# Patient Record
Sex: Female | Born: 1975 | Race: White | Hispanic: No | Marital: Married | Smoking: Former smoker
Health system: Southern US, Community
[De-identification: ages and names within clinical notes are randomized; demographics above are authoritative.]

## PROBLEM LIST (undated history)

## (undated) DIAGNOSIS — R569 Unspecified convulsions: Secondary | ICD-10-CM

## (undated) DIAGNOSIS — G8929 Other chronic pain: Secondary | ICD-10-CM

## (undated) DIAGNOSIS — K200 Eosinophilic esophagitis: Secondary | ICD-10-CM

## (undated) DIAGNOSIS — K219 Gastro-esophageal reflux disease without esophagitis: Secondary | ICD-10-CM

## (undated) DIAGNOSIS — S62309A Unspecified fracture of unspecified metacarpal bone, initial encounter for closed fracture: Secondary | ICD-10-CM

## (undated) DIAGNOSIS — T7840XA Allergy, unspecified, initial encounter: Secondary | ICD-10-CM

## (undated) DIAGNOSIS — S069X9A Unspecified intracranial injury with loss of consciousness of unspecified duration, initial encounter: Secondary | ICD-10-CM

## (undated) DIAGNOSIS — R519 Headache, unspecified: Secondary | ICD-10-CM

## (undated) HISTORY — DX: Unspecified convulsions: R56.9

## (undated) HISTORY — DX: Eosinophilic esophagitis: K20.0

## (undated) HISTORY — PX: BRAIN SURGERY: SHX531

## (undated) HISTORY — PX: TOE SURGERY: SHX1073

## (undated) HISTORY — DX: Headache, unspecified: R51.9

## (undated) HISTORY — DX: Allergy, unspecified, initial encounter: T78.40XA

## (undated) HISTORY — PX: ACHILLES TENDON SURGERY: SHX542

## (undated) HISTORY — PX: STRABISMUS SURGERY: SHX218

## (undated) HISTORY — PX: UPPER GASTROINTESTINAL ENDOSCOPY: SHX188

## (undated) HISTORY — DX: Other chronic pain: G89.29

## (undated) HISTORY — DX: Gastro-esophageal reflux disease without esophagitis: K21.9

---

## 1997-11-07 DIAGNOSIS — S069XAA Unspecified intracranial injury with loss of consciousness status unknown, initial encounter: Secondary | ICD-10-CM

## 1997-11-07 DIAGNOSIS — S069X9A Unspecified intracranial injury with loss of consciousness of unspecified duration, initial encounter: Secondary | ICD-10-CM

## 1997-11-07 HISTORY — DX: Unspecified intracranial injury with loss of consciousness of unspecified duration, initial encounter: S06.9X9A

## 1997-11-07 HISTORY — PX: TRACHEOSTOMY: SUR1362

## 1997-11-07 HISTORY — DX: Unspecified intracranial injury with loss of consciousness status unknown, initial encounter: S06.9XAA

## 1998-10-19 ENCOUNTER — Encounter: Admission: RE | Admit: 1998-10-19 | Discharge: 1999-01-14 | Payer: Self-pay

## 1999-01-14 ENCOUNTER — Encounter
Admission: RE | Admit: 1999-01-14 | Discharge: 1999-04-14 | Payer: Self-pay | Admitting: Physical Medicine and Rehabilitation

## 1999-03-29 ENCOUNTER — Encounter: Payer: Self-pay | Admitting: Physical Medicine and Rehabilitation

## 1999-03-29 ENCOUNTER — Ambulatory Visit (HOSPITAL_COMMUNITY)
Admission: RE | Admit: 1999-03-29 | Discharge: 1999-03-29 | Payer: Self-pay | Admitting: Physical Medicine and Rehabilitation

## 1999-06-18 ENCOUNTER — Encounter: Admission: RE | Admit: 1999-06-18 | Discharge: 1999-09-16 | Payer: Self-pay

## 1999-08-12 ENCOUNTER — Encounter
Admission: RE | Admit: 1999-08-12 | Discharge: 1999-11-10 | Payer: Self-pay | Admitting: Physical Medicine and Rehabilitation

## 1999-08-23 ENCOUNTER — Encounter
Admission: RE | Admit: 1999-08-23 | Discharge: 1999-11-21 | Payer: Self-pay | Admitting: Physical Medicine and Rehabilitation

## 1999-11-08 HISTORY — PX: ENDOMETRIAL ABLATION W/ NOVASURE: SUR434

## 1999-12-13 ENCOUNTER — Encounter
Admission: RE | Admit: 1999-12-13 | Discharge: 2000-03-12 | Payer: Self-pay | Admitting: Physical Medicine and Rehabilitation

## 1999-12-27 ENCOUNTER — Other Ambulatory Visit: Admission: RE | Admit: 1999-12-27 | Discharge: 1999-12-27 | Payer: Self-pay | Admitting: Obstetrics and Gynecology

## 2000-03-14 ENCOUNTER — Encounter
Admission: RE | Admit: 2000-03-14 | Discharge: 2000-03-31 | Payer: Self-pay | Admitting: Physical Medicine and Rehabilitation

## 2000-03-28 ENCOUNTER — Encounter
Admission: RE | Admit: 2000-03-28 | Discharge: 2000-06-26 | Payer: Self-pay | Admitting: Physical Medicine and Rehabilitation

## 2000-10-24 ENCOUNTER — Encounter: Admission: RE | Admit: 2000-10-24 | Discharge: 2000-11-06 | Payer: Self-pay | Admitting: *Deleted

## 2000-12-14 ENCOUNTER — Encounter: Admission: RE | Admit: 2000-12-14 | Discharge: 2001-02-20 | Payer: Self-pay | Admitting: *Deleted

## 2001-01-01 ENCOUNTER — Other Ambulatory Visit: Admission: RE | Admit: 2001-01-01 | Discharge: 2001-01-01 | Payer: Self-pay | Admitting: Obstetrics and Gynecology

## 2001-02-15 ENCOUNTER — Encounter
Admission: RE | Admit: 2001-02-15 | Discharge: 2001-05-16 | Payer: Self-pay | Admitting: Physical Medicine and Rehabilitation

## 2002-01-23 ENCOUNTER — Other Ambulatory Visit: Admission: RE | Admit: 2002-01-23 | Discharge: 2002-01-23 | Payer: Self-pay | Admitting: Obstetrics and Gynecology

## 2003-02-04 ENCOUNTER — Other Ambulatory Visit: Admission: RE | Admit: 2003-02-04 | Discharge: 2003-02-04 | Payer: Self-pay | Admitting: Obstetrics and Gynecology

## 2003-12-31 ENCOUNTER — Inpatient Hospital Stay (HOSPITAL_COMMUNITY): Admission: AD | Admit: 2003-12-31 | Discharge: 2003-12-31 | Payer: Self-pay | Admitting: Obstetrics and Gynecology

## 2004-01-09 ENCOUNTER — Inpatient Hospital Stay (HOSPITAL_COMMUNITY): Admission: AD | Admit: 2004-01-09 | Discharge: 2004-01-09 | Payer: Self-pay | Admitting: Obstetrics and Gynecology

## 2004-01-16 ENCOUNTER — Inpatient Hospital Stay (HOSPITAL_COMMUNITY): Admission: AD | Admit: 2004-01-16 | Discharge: 2004-01-20 | Payer: Self-pay | Admitting: Obstetrics and Gynecology

## 2004-01-17 ENCOUNTER — Encounter (INDEPENDENT_AMBULATORY_CARE_PROVIDER_SITE_OTHER): Payer: Self-pay | Admitting: Specialist

## 2004-03-02 ENCOUNTER — Other Ambulatory Visit: Admission: RE | Admit: 2004-03-02 | Discharge: 2004-03-02 | Payer: Self-pay | Admitting: Obstetrics and Gynecology

## 2005-05-20 ENCOUNTER — Other Ambulatory Visit: Admission: RE | Admit: 2005-05-20 | Discharge: 2005-05-20 | Payer: Self-pay | Admitting: Obstetrics and Gynecology

## 2006-12-29 ENCOUNTER — Inpatient Hospital Stay (HOSPITAL_COMMUNITY): Admission: RE | Admit: 2006-12-29 | Discharge: 2007-01-01 | Payer: Self-pay | Admitting: Obstetrics and Gynecology

## 2007-03-01 ENCOUNTER — Ambulatory Visit: Admission: RE | Admit: 2007-03-01 | Discharge: 2007-03-01 | Payer: Self-pay | Admitting: Obstetrics and Gynecology

## 2010-02-02 ENCOUNTER — Inpatient Hospital Stay (HOSPITAL_COMMUNITY): Admission: RE | Admit: 2010-02-02 | Discharge: 2010-02-05 | Payer: Self-pay | Admitting: Obstetrics and Gynecology

## 2011-01-30 LAB — CBC
HCT: 28.6 % — ABNORMAL LOW (ref 36.0–46.0)
HCT: 28.8 % — ABNORMAL LOW (ref 36.0–46.0)
HCT: 38.7 % (ref 36.0–46.0)
Hemoglobin: 10 g/dL — ABNORMAL LOW (ref 12.0–15.0)
Hemoglobin: 10.1 g/dL — ABNORMAL LOW (ref 12.0–15.0)
Hemoglobin: 13.2 g/dL (ref 12.0–15.0)
MCHC: 34 g/dL (ref 30.0–36.0)
MCHC: 34.9 g/dL (ref 30.0–36.0)
MCHC: 35.2 g/dL (ref 30.0–36.0)
MCV: 97.8 fL (ref 78.0–100.0)
MCV: 98.4 fL (ref 78.0–100.0)
MCV: 98.9 fL (ref 78.0–100.0)
Platelets: 105 10*3/uL — ABNORMAL LOW (ref 150–400)
Platelets: 92 10*3/uL — ABNORMAL LOW (ref 150–400)
Platelets: 98 10*3/uL — ABNORMAL LOW (ref 150–400)
RBC: 2.91 MIL/uL — ABNORMAL LOW (ref 3.87–5.11)
RBC: 2.91 MIL/uL — ABNORMAL LOW (ref 3.87–5.11)
RBC: 3.96 MIL/uL (ref 3.87–5.11)
RDW: 13.9 % (ref 11.5–15.5)
RDW: 14.1 % (ref 11.5–15.5)
RDW: 14.3 % (ref 11.5–15.5)
WBC: 8.1 10*3/uL (ref 4.0–10.5)
WBC: 8.6 10*3/uL (ref 4.0–10.5)
WBC: 8.7 10*3/uL (ref 4.0–10.5)

## 2011-01-30 LAB — CCBB MATERNAL DONOR DRAW

## 2011-01-30 LAB — RPR: RPR Ser Ql: NONREACTIVE

## 2011-03-25 NOTE — Discharge Summary (Signed)
NAMELACYE, MCCARN                ACCOUNT NO.:  [id number]   MEDICAL RECORD NO.:  [id number]          PATIENT TYPE:  INP   LOCATION:  9104                          FACILITY:  [facility]   PHYSICIAN:  Zelphia Cairo, MD    DATE OF BIRTH:  [birth year]   DATE OF ADMISSION:  12/29/2006  DATE OF DISCHARGE:  01/01/2007                               DISCHARGE SUMMARY   ADMITTING DIAGNOSES:  1. Intrauterine pregnancy at 76 weeks estimated gestational age.  2. Previous cesarean section, desires repeat.   DISCHARGE DIAGNOSES:  1. Status post low transverse cesarean section.  2. A viable female infant.   PROCEDURE:  Repeat low transverse cesarean section.   REASON FOR ADMISSION:  Please see written H&P.   HOSPITAL COURSE:  The patient is a 35 year old gravida 2, para 1 who  presented to [facility] at 39 weeks estimated  gestational age for a scheduled cesarean section.  On the morning of  admission, the patient was taken to the operating room where spinal  anesthesia was administered without difficulty.  A low transverse  incision was made with delivery of a viable female infant weighing 7  pounds 12 ounces, Apgars of 9 at one minute and 9 at five minutes.  The  patient tolerated the procedure well and was taken to the recovery room  in stable condition.   On postoperative day #1, the patient was without complaint.  Vital signs  were stable.  Fundus firm and nontender.  Abdominal dressing was noted  to be clean, dry and intact.  Laboratory findings revealed hemoglobin  9.8, platelet count 113,000.   On postoperative day #2, the patient was without complaint.  Vital signs  were stable.  She was afebrile.  Abdomen soft.  Fundus firm and  nontender.  Incision was clean, dry and intact.  Repeat laboratory  findings revealed hemoglobin of 9.7, platelet count up to 132,000.   On postoperative day #3, the patient was without complaint.  Vital signs  were stable.  She was afebrile.   Abdomen was soft.  Fundus firm and  nontender.  Incision was clean, dry and intact.  Staples were removed.  The patient was later discharged home.   CONDITION ON DISCHARGE:  Good.   DISCHARGE DIET:  Regular as tolerated.   ACTIVITY:  No heavy lifting, no driving x2 weeks, no vaginal entry.   FOLLOW UP:  Patient to follow up in the office in 1-2 weeks for incision  check.  She is to call for a temperature greater than 100 degrees,  persistent nausea, vomiting, heavy vaginal bleeding and/or redness or  drainage from incisional site.   DISCHARGE MEDICATIONS:  1. __________ 5/325 #30 one p.o. every 6 hours p.r.n.  2. Motrin 600 mg every 6 hours.  3. Prenatal vitamins one p.o. daily.  4. Colace one p.o. daily p.r.n.      Julio Sicks, N.P.      Zelphia Cairo, MD  Electronically Signed    CC/MEDQ  D:  02/05/2007  T:  02/05/2007  Job:  [id number]

## 2011-03-25 NOTE — Op Note (Signed)
NAME:  [name], [name]                          ACCOUNT NO.:  [id number]   MEDICAL RECORD NO.:  [id number]                   PATIENT TYPE:  INP   LOCATION:  9127                                 FACILITY:  [facility]   PHYSICIAN:  Duke Salvia. Marcelle Overlie, M.D.            DATE OF BIRTH:  07-Oct-1976   DATE OF PROCEDURE:  01/17/2004  DATE OF DISCHARGE:                                 OPERATIVE REPORT   PREOPERATIVE DIAGNOSES:  1. Failed vacuum extraction.  2. Cephalopelvic disproportion.   POSTOPERATIVE DIAGNOSES:  1. Failed vacuum extraction.  2. Cephalopelvic disproportion.  3. Occiput posterior presentation.   PROCEDURE:  1. Trial vacuum extraction.  2. Low transverse cesarean section.   SURGEON:  Duke Salvia. Marcelle Overlie, M.D.   ANESTHESIA:  Epidural.   COMPLICATIONS:  None.   DRAINS:  Foley catheter.   ESTIMATED BLOOD LOSS:  800 cc.   INDICATIONS:  This patient had been pushing for 2-1/2 to 3 hr, was known to  be plus-2 to plus-3; was exhausted with a stable FHR.  After discussion with  the patient and her husband, she consented to a trial VE.  The bladder had  been drained.  The legs were placed in stirrups.  The Kiwi was applied with  two traction efforts coordinated with maternal pushing.  There was  absolutely no descent noted, so the VE was removed at that point.  She was  taken to the OR at that point with stable FHR.  She had already been on  antibiotics due to prolonged rupture of membranes.   DESCRIPTION OF PROCEDURE AND FINDINGS:  The patient was taken to the  operating room.  After an adequate level of epidural was obtained with the  patient supine, the abdomen was prepped and draped in the usual manner for  sterile abdominal procedures.  A Foley catheter was positioned, draining  clear urine.  A transverse Pfannenstiel incision was made two fingerbreadths  below the symphysis, carried down to fascia; which was incised and extended  transversely.  Rectus muscles were divided  in the midline.  The peritoneum  entered superiorly without incident and extended in a vertical manner.  The  vesicouterine serosa was then incised, and the bladder was bluntly and  sharply dissected off the lower uterine segment.  Bladder blade was  positioned.  A transverse incision made in the lower segment, extended with  blunt dissection.  Clear fluid was noted.   The fetus was known to be straight OP.  The vertex was easily rotated and  delivered without difficulty.  The infant was suctioned, the cord clamped  and infant passed to the pediatric team for further care.  The pH was sent  and was pending.  Apgars were 9 and 9.   The placenta was removed manually intact, was sent to pathology.  The cavity  was wiped clean with a laparotomy pack.  Closure was obtained with the first  layer of 0 chromic, followed by an embrocating layer of 0 chromic -- this  was hemostatic.  Bladder flap area was inspected and noted to be intact and  hemostatic.  Tubes and ovaries were normal.  Prior to closure sponge, needle  and instrument counts were reported as correct x2.  The peritoneum was then  closed with running 2-0 Dexon suture.  Rectus muscles were reapproximated  with 2-0 Vicryl interrupted sutures.  The fascia was closed from laterally  to midline on either side with 0 PDS suture.  The subcutaneous fat was  hemostatic.  Clips and Steri-Strips were used on the skin.  Clear urine  noted at the end of the case.  She tolerated this well and went to the  recovery room in good condition.                                               Richard M. Marcelle Overlie, M.D.    RMH/MEDQ  D:  01/17/2004  T:  01/18/2004  Job:  [id number]

## 2011-03-25 NOTE — Discharge Summary (Signed)
NAME:  [name], [name]                          ACCOUNT NO.:  [id number]   MEDICAL RECORD NO.:  [id number]                   PATIENT TYPE:  INP   LOCATION:  9127                                 [facility]ILITY[facility]   PHYSICIAN:  Freddy Finner, M.D.                DATE OF BIRTH:  November 03, 1976   DATE OF ADMISSION:  01/16/2004  DATE OF DISCHARGE:  01/20/2004                                 DISCHARGE SUMMARY   ADMITTING DIAGNOSES:  1. Intrauterine pregnancy at term.  2. Spontaneous rupture of membranes.   DISCHARGE DIAGNOSES:  1. Status post low transverse cesarean section secondary to cephalopelvic     disproportion.  2. Viable female infant.   PROCEDURE:  1. Failed vacuum extractor.  2. Primary low transverse cesarean section.   REASON FOR ADMISSION:  Please see written H&P.   HOSPITAL COURSE:  The patient was a 35 year old primigravida that was  admitted to [facility] with spontaneous rupture of  membranes in early labor. On admission vital signs were stable.  Fetal heart  tones were reassuring at 142.  Cervix was dilated 3 cm, 50 % effaced, vertex  at a -2 station.  Amniotic fluid was noted to be clear.  IV antibiotics were  given due to prolonged spontaneous rupture of membranes.  The patient did  progress to full dilation. However, after pushing for 2-and-a-half to 3  hours the vertex was noted to continue to be at a -2 station.  The patient  was exhausted.  After discussion with the patient and her husband decision  was made for a trial of a vacuum extractor.  The Kiwi was applied with two  traction efforts coordinated with maternal pushing.  There was absolutely no  descent that was noted so the VE was removed at that point.  The patient was  then transferred to the operating room where epidural was dosed to an  adequate surgical level.  A low transverse incision was made with the  delivery of a viable female infant weighing 9 pounds 7 ounces, Apgars of 9 at  one  minute and 9 at five minutes.  Umbilical cord pH was 7.34.  The patient  tolerated the procedure well and was taken to the recovery room in stable  condition.  On postoperative day #1 the patient was without complaint.  Vital signs were stable; she was afebrile.  Lungs were clear to  auscultation.  Abdomen was soft.  Fundus was firm.  Incision was clean, dry,  and intact.  On postoperative day #2 the patient was without complaint.  Vital signs continued to be stable; she was afebrile.  Incision was clean,  dry, and intact.  She was ambulating well and tolerating a regular diet  without complaints of nausea and vomiting.  On postoperative day #3 the  patient was without complaint.  Vital signs were stable.  Fundus was firm  and nontender.  Incision was clean, dry, and intact.  Staples were removed  and the patient was discharged home.   CONDITION ON DISCHARGE:  Good.   DIET:  Regular as tolerated.   ACTIVITY:  No heavy lifting, no driving x2 weeks, no vaginal entry.   FOLLOW-UP:  The patient is to follow up in office in 1-2 weeks for an  incision check.  She is to call for temperature greater than 100 degrees,  persistent nausea and vomiting, heavy vaginal bleeding, and/or redness or  drainage from the incisional site.   DISCHARGE MEDICATIONS:  1. Percocet 5/325 #30 one p.o. q.4-6h. p.r.n. pain.  2. Motrin 600 mg q.6h. p.r.n.  3. Prenatal vitamins one p.o. daily.  4. Colace one p.o. daily p.r.n.     Julio Sicks, N.P.                        Freddy Finner, M.D.    CC/MEDQ  D:  02/10/2004  T:  02/10/2004  Job:  [id number]

## 2011-03-25 NOTE — Op Note (Signed)
[name], [name]                ACCOUNT NO.:  [id number]   MEDICAL RECORD NO.:  [id number]          PATIENT TYPE:  INP   LOCATION:  9199                          [facility]ACILITY:  [facility]   PHYSICIAN:  Duke Salvia. Marcelle Overlie, M.D.DATE OF BIRTH:  09/07/1976   DATE OF PROCEDURE:  12/29/2006  DATE OF DISCHARGE:                               OPERATIVE REPORT   PREOPERATIVE DIAGNOSIS:  Term intrauterine pregnancy, for repeat  cesarean section.   POSTOPERATIVE DIAGNOSIS:  Term intrauterine pregnancy, for repeat  cesarean section.   PROCEDURE:  Repeat low transverse cesarean section.   SURGEON:  [place].   ANESTHESIA:  Spinal.   COMPLICATIONS:  None.   DRAINS:  Foley catheter.   BLOOD LOSS:  600.   PROCEDURE AND FINDINGS:  The patient was taken to the operating room.  After an adequate level of regional anesthesia was obtained with the  patient supine, the abdomen was prepped and draped in the usual manner  for sterile operative procedures.  Foley catheter positioned, draining  clear urine.  Transverse incision was made excising the old scar,  carried down to the fascia which was incised and extended transversely.  Rectus muscle was divided in the midline.  Peritoneum entered superiorly  without incident and extended vertically.  The vesicouterine serosa was  then incised, and bladder was bluntly and sharply dissected off of the  lower uterine segment.  Bladder blade was repositioned.  Transverse  incision made in the lower segment, extended transversely with bandage  scissors.  Clear fluid noted.  The patient delivered of a healthy female,  Apgars 9 and 9 from an OP presentation without difficulty.  The placenta  was delivered spontaneously, sent for cord blood donation.  Uterus  exteriorized, cavity wiped clean with a laparotomy pack.  Closured  obtained with a first layer of 0 chromic in a locked fashion followed by  an imbricating layer of 0 chromic.  This was hemostatic.  The bladder  flap area was intact and hemostatic.  Tubes and ovaries were normal.  Prior to closure, sponge, needle and instrument counts were reported as  correct x2.  Perineum closed with a running 3-0 Vicryl suture.  Rectus  muscles closed with interrupted 3-0 Vicryl sutures.  Fascia closed from  laterally to midline on either side with a 0 PDS suture.  Subcutaneous  fat was hemostatic.  Steri-Strips used on skin along with a pressure  sterile dressing.  Clear urine noted at the end of the case.  She did  receive Pitocin IV after the cord was clamped, along with Rocephin as  prophylactic antibiotic.  Mother and baby doing well at that.     Richard M. Marcelle Overlie, M.D.  Electronically Signed    RMH/MEDQ  D:  12/29/2006  T:  12/29/2006  Job:  [id number]

## 2011-06-06 ENCOUNTER — Encounter: Payer: Self-pay | Admitting: Genetic Counselor

## 2011-06-10 ENCOUNTER — Encounter: Payer: BC Managed Care – PPO | Admitting: Genetic Counselor

## 2011-10-28 ENCOUNTER — Ambulatory Visit: Payer: BC Managed Care – PPO

## 2011-10-28 NOTE — Progress Notes (Signed)
Pt. Seen for blood draw for BRCA 1/2.  Had been for counseling several months ago.

## 2014-03-10 ENCOUNTER — Emergency Department (HOSPITAL_COMMUNITY): Payer: BC Managed Care – PPO

## 2014-03-10 ENCOUNTER — Encounter (HOSPITAL_COMMUNITY): Payer: Self-pay | Admitting: Emergency Medicine

## 2014-03-10 ENCOUNTER — Emergency Department (HOSPITAL_COMMUNITY)
Admission: EM | Admit: 2014-03-10 | Discharge: 2014-03-10 | Disposition: A | Discharge status: Home / Self Care | Payer: BC Managed Care – PPO | Attending: Emergency Medicine | Admitting: Emergency Medicine

## 2014-03-10 DIAGNOSIS — S62339A Displaced fracture of neck of unspecified metacarpal bone, initial encounter for closed fracture: Secondary | ICD-10-CM | POA: Insufficient documentation

## 2014-03-10 DIAGNOSIS — Z87820 Personal history of traumatic brain injury: Secondary | ICD-10-CM | POA: Insufficient documentation

## 2014-03-10 DIAGNOSIS — S62306A Unspecified fracture of fifth metacarpal bone, right hand, initial encounter for closed fracture: Secondary | ICD-10-CM

## 2014-03-10 DIAGNOSIS — S62329A Displaced fracture of shaft of unspecified metacarpal bone, initial encounter for closed fracture: Secondary | ICD-10-CM | POA: Insufficient documentation

## 2014-03-10 DIAGNOSIS — Y9389 Activity, other specified: Secondary | ICD-10-CM | POA: Insufficient documentation

## 2014-03-10 DIAGNOSIS — Z8669 Personal history of other diseases of the nervous system and sense organs: Secondary | ICD-10-CM | POA: Insufficient documentation

## 2014-03-10 DIAGNOSIS — Y92410 Unspecified street and highway as the place of occurrence of the external cause: Secondary | ICD-10-CM | POA: Insufficient documentation

## 2014-03-10 DIAGNOSIS — IMO0002 Reserved for concepts with insufficient information to code with codable children: Secondary | ICD-10-CM | POA: Insufficient documentation

## 2014-03-10 DIAGNOSIS — S62319A Displaced fracture of base of unspecified metacarpal bone, initial encounter for closed fracture: Secondary | ICD-10-CM | POA: Insufficient documentation

## 2014-03-10 HISTORY — DX: Unspecified intracranial injury with loss of consciousness of unspecified duration, initial encounter: S06.9X9A

## 2014-03-10 MED ORDER — OXYCODONE-ACETAMINOPHEN 5-325 MG PO TABS
1.0000 | ORAL_TABLET | Freq: Once | ORAL | Status: AC
  Administered 2014-03-10: 0.5 via ORAL
  Filled 2014-03-10: qty 1

## 2014-03-10 MED ORDER — OXYCODONE-ACETAMINOPHEN 5-325 MG PO TABS: 1.0000 | ORAL_TABLET | Freq: Three times a day (TID) | ORAL | Status: DC | PRN

## 2014-03-10 NOTE — Discharge Instructions (Signed)
Please call your doctor for a followup appointment within 24-48 hours. When you talk to your doctor please let them know that you were seen in the emergency department and have them acquire all of your records so that they can discuss the findings with you and formulate a treatment plan to fully care for your new and ongoing problems. Please call and set-up an appointment with your primary care provider to be reassessed within the next 24-48 hours Please rest and stay hydrated Please call and specialist office tomorrow to set up an appointment to be seen Please avoid any physical or strenuous activity Please take pain medications as prescribed rash on pain medications is to be no drinking alcohol, driving, operating any heavy machinery. If there is extra please disposer proper manner. Please while on pain medications do not take any extra Tylenol for this can lead to Tylenol overdose and liver failure which can be fatal. Please continue monitor symptoms closely and if symptoms are to worsen or change (fever greater than 101, chills, chest pain, shortness of breath, difficulty breathing, numbness, tingling, worsening changes to pain symptoms, weakness, fall, injury) please report back to the ED immediately   Boxer's Fracture Boxer's fracture is a broken bone (fracture) of the fourth or fifth metacarpal (ring or pinky finger). The metacarpal bones connect the wrist to the fingers and make up the arch of the hand. Boxer's fracture occurs toward the body (proximal) from the first knuckle. This injury is known as a boxer's fracture, because it often occurs from hitting an object with a closed fist. SYMPTOMS   Severe pain at the time of injury.  Pain and swelling around the first knuckle on the fourth or fifth finger.  Bruising (contusion) in the area within 48 hours of injury.  Visible deformity, such as a pushed down knuckle. This can occur if the fracture is complete, and the bone fragments separate  enough to distort normal body shape.  Numbness or paralysis from swelling in the hand, causing pressure on the blood vessels or nerves (uncommon). CAUSES   Direct injury (trauma), such as a striking blow with the fist.  Indirect stress to the hand, such as twisting or violent muscle contraction (uncommon). RISK INCREASES WITH:  Punching an object with an unprotected knuckle.  Contact sports (football, rugby).  Sports that require hitting (boxing, martial arts).  History of bone or joint disease (osteoporosis). PREVENTION  Maintain physical fitness:  Muscle strength and flexibility.  Endurance.  Cardiovascular fitness.  For participation in contact sports, wear proper protective equipment for the hand and make sure it fits properly.  Learn and use proper technique when hitting or punching. PROGNOSIS  When proper treatment is given, to ensure normal alignment of the bones, healing can usually be expected in 4 to 6 weeks. Occasionally, surgery is necessary.  RELATED COMPLICATIONS   Bone does not heal back together (nonunion).  Bone heals together in an improper position (malunion), causing twisting of the finger when making a fist.  Chronic pain, stiffness, or swelling of the hand.  Excessive bleeding in the hand, causing pressure and injury to nerves and blood vessels (rare).  Stopping of normal hand growth in children.  Infection of the wound, if skin is broken over the fracture (open fracture), or at the incision site if surgery is performed.  Shortening of injured bones.  Bony bump (prominence) in the palm or loss of shape of the knuckles.  Pain and weakness when gripping.  Arthritis of the affected  joint, if the fracture goes into the joint, after repeated injury, or after delayed treatment.  Scarring around the knuckle, and limited motion. TREATMENT  Treatment varies, depending on the injury. The place in the hand where the injury occurs has a great deal of  motion, which allows the hand to move properly. If the fracture is not aligned properly, this function may be decreased. If the bone ends are in proper alignment, treatment first involves ice and elevation of the injured hand, at or above heart level, to reduce inflammation. Pain medicines help to relieve pain. Treatment also involves restraint by splinting, bandaging, casting, or bracing for 4 or more weeks.  If the fracture is out of alignment (displaced), or it involves the joint, surgery is usually recommended. Surgery typically involves cutting through the skin to place removable pins, screws, and sometimes plates over the fracture. After surgery, the bone and joint are restrained for 4 or more weeks. After restraint (with or without surgery), stretching and strengthening exercises are needed to regain proper strength and function of the joint. Exercises may be done at home or with the assistance of a therapist. Depending on the sport and position played, a brace or splint may be recommended when first returning to sports.  MEDICATION   If pain medication is necessary, nonsteroidal anti-inflammatory medications, such as aspirin and ibuprofen, or other minor pain relievers, such as acetaminophen, are often recommended.  Do not take pain medication for 7 days before surgery.  Prescription pain relievers may be necessary. Use only as directed and only as much as you need. COLD THERAPY Cold treatment (icing) relieves pain and reduces inflammation. Cold treatment should be applied for 10 to 15 minutes every 2 to 3 hours for inflammation and pain, and immediately after any activity that aggravates your symptoms. Use ice packs or an ice massage. SEEK MEDICAL CARE IF:   Pain, tenderness, or swelling gets worse, despite treatment.  You experience pain, numbness, or coldness in the hand.  Blue, gray, or dark color appears in the fingernails.  You develop signs of infection, after surgery (fever,  increased pain, swelling, redness, drainage of fluids, or bleeding in the surgical area).  You feel you have reinjured the hand.  New, unexplained symptoms develop. (Drugs used in treatment may produce side effects.) Document Released: 10/24/2005 Document Revised: 01/16/2012 Document Reviewed: 02/05/2009 [facility] Patient Information 2014 [place], [place].

## 2014-03-10 NOTE — ED Notes (Signed)
PA at bedside.

## 2014-03-10 NOTE — ED Notes (Signed)
Ortho notified of ulnar gutter.

## 2014-03-10 NOTE — ED Notes (Signed)
Ortho at bedside.
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
## 2014-03-10 NOTE — Progress Notes (Signed)
Orthopedic Tech Progress Note Patient Details:  Alicia BrashLacy A Rocha 09/17/1976 [id number]  Ortho Devices Type of Ortho Device: Ace wrap;Arm sling;Ulna gutter splint Ortho Device/Splint Location: rue Ortho Device/Splint Interventions: Application   Carol Loftin 03/10/2014, 8:56 PM
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
## 2014-03-12 ENCOUNTER — Encounter (HOSPITAL_BASED_OUTPATIENT_CLINIC_OR_DEPARTMENT_OTHER): Payer: Self-pay | Admitting: *Deleted

## 2014-03-13 NOTE — ED Provider Notes (Signed)
Medical screening examination/treatment/procedure(s) were performed by non-physician practitioner and as supervising physician I was immediately available for consultation/collaboration.  Osten Janek Y. Allenmichael Mcpartlin, MD 03/13/14 2252 

## 2014-03-14 ENCOUNTER — Encounter (HOSPITAL_BASED_OUTPATIENT_CLINIC_OR_DEPARTMENT_OTHER): Payer: BC Managed Care – PPO | Admitting: Anesthesiology

## 2014-03-14 ENCOUNTER — Encounter (HOSPITAL_BASED_OUTPATIENT_CLINIC_OR_DEPARTMENT_OTHER)
Admission: RE | Disposition: A | Discharge status: Home / Self Care | Payer: Self-pay | Source: Ambulatory Visit | Attending: Orthopedic Surgery

## 2014-03-14 ENCOUNTER — Ambulatory Visit (HOSPITAL_BASED_OUTPATIENT_CLINIC_OR_DEPARTMENT_OTHER)
Admission: RE | Admit: 2014-03-14 | Discharge: 2014-03-14 | Disposition: A | Discharge status: Home / Self Care | Payer: BC Managed Care – PPO | Source: Ambulatory Visit | Attending: Orthopedic Surgery | Admitting: Orthopedic Surgery

## 2014-03-14 ENCOUNTER — Ambulatory Visit (HOSPITAL_BASED_OUTPATIENT_CLINIC_OR_DEPARTMENT_OTHER): Payer: BC Managed Care – PPO | Admitting: Anesthesiology

## 2014-03-14 ENCOUNTER — Encounter (HOSPITAL_BASED_OUTPATIENT_CLINIC_OR_DEPARTMENT_OTHER): Payer: Self-pay

## 2014-03-14 DIAGNOSIS — X58XXXA Exposure to other specified factors, initial encounter: Secondary | ICD-10-CM | POA: Insufficient documentation

## 2014-03-14 DIAGNOSIS — S62309A Unspecified fracture of unspecified metacarpal bone, initial encounter for closed fracture: Secondary | ICD-10-CM | POA: Insufficient documentation

## 2014-03-14 DIAGNOSIS — Y929 Unspecified place or not applicable: Secondary | ICD-10-CM | POA: Insufficient documentation

## 2014-03-14 HISTORY — PX: OPEN REDUCTION INTERNAL FIXATION (ORIF) METACARPAL: SHX6234

## 2014-03-14 HISTORY — DX: Unspecified fracture of unspecified metacarpal bone, initial encounter for closed fracture: S62.309A

## 2014-03-14 LAB — POCT HEMOGLOBIN-HEMACUE: Hemoglobin: 14.4 g/dL (ref 12.0–15.0)

## 2014-03-14 SURGERY — OPEN REDUCTION INTERNAL FIXATION (ORIF) METACARPAL
Anesthesia: General | Site: Hand | Laterality: Right

## 2014-03-14 MED ORDER — CEFAZOLIN SODIUM-DEXTROSE 2-3 GM-% IV SOLR
INTRAVENOUS | Status: AC
  Filled 2014-03-14: qty 50

## 2014-03-14 MED ORDER — LIDOCAINE HCL (CARDIAC) 20 MG/ML IV SOLN
INTRAVENOUS | Status: DC | PRN
  Administered 2014-03-14: 80 mg via INTRAVENOUS

## 2014-03-14 MED ORDER — DEXAMETHASONE SODIUM PHOSPHATE 4 MG/ML IJ SOLN
INTRAMUSCULAR | Status: DC | PRN
  Administered 2014-03-14: 10 mg via INTRAVENOUS

## 2014-03-14 MED ORDER — CEFAZOLIN SODIUM-DEXTROSE 2-3 GM-% IV SOLR
INTRAVENOUS | Status: DC | PRN
  Administered 2014-03-14: 2 g via INTRAVENOUS

## 2014-03-14 MED ORDER — OXYCODONE HCL 5 MG PO TABS: 5.0000 mg | ORAL_TABLET | Freq: Once | ORAL | Status: DC | PRN

## 2014-03-14 MED ORDER — ROPIVACAINE HCL 5 MG/ML IJ SOLN
INTRAMUSCULAR | Status: DC | PRN
  Administered 2014-03-14: 30 mL via PERINEURAL

## 2014-03-14 MED ORDER — FENTANYL CITRATE 0.05 MG/ML IJ SOLN: 50.0000 ug | INTRAMUSCULAR | Status: DC | PRN

## 2014-03-14 MED ORDER — LACTATED RINGERS IV SOLN
INTRAVENOUS | Status: DC
  Administered 2014-03-14 (×2): via INTRAVENOUS

## 2014-03-14 MED ORDER — ONDANSETRON HCL 4 MG/2ML IJ SOLN
INTRAMUSCULAR | Status: DC | PRN
  Administered 2014-03-14: 4 mg via INTRAVENOUS

## 2014-03-14 MED ORDER — BUPIVACAINE HCL (PF) 0.5 % IJ SOLN
INTRAMUSCULAR | Status: AC
  Filled 2014-03-14: qty 30

## 2014-03-14 MED ORDER — PROPOFOL 10 MG/ML IV BOLUS
INTRAVENOUS | Status: DC | PRN
  Administered 2014-03-14: 180 mg via INTRAVENOUS

## 2014-03-14 MED ORDER — FENTANYL CITRATE 0.05 MG/ML IJ SOLN
INTRAMUSCULAR | Status: AC
  Filled 2014-03-14: qty 4

## 2014-03-14 MED ORDER — MIDAZOLAM HCL 2 MG/2ML IJ SOLN: 1.0000 mg | INTRAMUSCULAR | Status: DC | PRN

## 2014-03-14 MED ORDER — BUPIVACAINE HCL (PF) 0.25 % IJ SOLN
INTRAMUSCULAR | Status: AC
  Filled 2014-03-14: qty 30

## 2014-03-14 MED ORDER — FENTANYL CITRATE 0.05 MG/ML IJ SOLN
50.0000 ug | INTRAMUSCULAR | Status: DC | PRN
  Administered 2014-03-14: 100 ug via INTRAVENOUS

## 2014-03-14 MED ORDER — MIDAZOLAM HCL 2 MG/2ML IJ SOLN
INTRAMUSCULAR | Status: AC
  Filled 2014-03-14: qty 2

## 2014-03-14 MED ORDER — MIDAZOLAM HCL 2 MG/2ML IJ SOLN
1.0000 mg | INTRAMUSCULAR | Status: DC | PRN
  Administered 2014-03-14: 2 mg via INTRAVENOUS

## 2014-03-14 MED ORDER — OXYCODONE HCL 5 MG/5ML PO SOLN: 5.0000 mg | Freq: Once | ORAL | Status: DC | PRN

## 2014-03-14 MED ORDER — 0.9 % SODIUM CHLORIDE (POUR BTL) OPTIME
TOPICAL | Status: DC | PRN
  Administered 2014-03-14: 200 mL

## 2014-03-14 MED ORDER — FENTANYL CITRATE 0.05 MG/ML IJ SOLN
INTRAMUSCULAR | Status: AC
  Filled 2014-03-14: qty 2

## 2014-03-14 MED ORDER — PROMETHAZINE HCL 25 MG/ML IJ SOLN
6.2500 mg | INTRAMUSCULAR | Status: DC | PRN
Start: 2014-03-14 — End: 2014-03-14

## 2014-03-14 MED ORDER — HYDROMORPHONE HCL PF 1 MG/ML IJ SOLN: 0.2500 mg | INTRAMUSCULAR | Status: DC | PRN

## 2014-03-14 SURGICAL SUPPLY — 66 items
BANDAGE ELASTIC 3 VELCRO ST LF (GAUZE/BANDAGES/DRESSINGS) ×6 IMPLANT
BLADE 15 SAFETY STRL DISP (BLADE) ×9 IMPLANT
BLADE SURG ROTATE 9660 (MISCELLANEOUS) ×3 IMPLANT
BNDG CONFORM 3 STRL LF (GAUZE/BANDAGES/DRESSINGS) ×3 IMPLANT
BNDG GAUZE ELAST 4 BULKY (GAUZE/BANDAGES/DRESSINGS) IMPLANT
BRUSH SCRUB EZ PLAIN DRY (MISCELLANEOUS) ×3 IMPLANT
CANISTER SUCT 1200ML W/VALVE (MISCELLANEOUS) IMPLANT
CORDS BIPOLAR (ELECTRODE) ×3 IMPLANT
COVER MAYO STAND STRL (DRAPES) ×3 IMPLANT
COVER TABLE BACK 60X90 (DRAPES) ×3 IMPLANT
CUFF TOURNIQUET SINGLE 18IN (TOURNIQUET CUFF) IMPLANT
DECANTER SPIKE VIAL GLASS SM (MISCELLANEOUS) IMPLANT
DRAPE EXTREMITY T 121X128X90 (DRAPE) ×3 IMPLANT
DRAPE OEC MINIVIEW 54X84 (DRAPES) ×3 IMPLANT
DRAPE SURG 17X23 STRL (DRAPES) ×3 IMPLANT
DRSG EMULSION OIL 3X3 NADH (GAUZE/BANDAGES/DRESSINGS) ×3 IMPLANT
GAUZE SPONGE 4X4 12PLY STRL (GAUZE/BANDAGES/DRESSINGS) ×3 IMPLANT
GAUZE SPONGE 4X4 16PLY XRAY LF (GAUZE/BANDAGES/DRESSINGS) IMPLANT
GLOVE BIO SURGEON STRL SZ 6.5 (GLOVE) ×2 IMPLANT
GLOVE BIO SURGEON STRL SZ8 (GLOVE) ×3 IMPLANT
GLOVE BIO SURGEONS STRL SZ 6.5 (GLOVE) ×1
GLOVE BIOGEL M STRL SZ7.5 (GLOVE) IMPLANT
GLOVE BIOGEL PI IND STRL 7.0 (GLOVE) ×1 IMPLANT
GLOVE BIOGEL PI INDICATOR 7.0 (GLOVE) ×2
GLOVE EXAM NITRILE MD LF STRL (GLOVE) ×3 IMPLANT
GLOVE SS BIOGEL STRL SZ 8 (GLOVE) ×1 IMPLANT
GLOVE SUPERSENSE BIOGEL SZ 8 (GLOVE) ×2
GOWN STRL REUS W/ TWL LRG LVL3 (GOWN DISPOSABLE) ×1 IMPLANT
GOWN STRL REUS W/ TWL XL LVL3 (GOWN DISPOSABLE) ×1 IMPLANT
GOWN STRL REUS W/TWL LRG LVL3 (GOWN DISPOSABLE) ×2
GOWN STRL REUS W/TWL XL LVL3 (GOWN DISPOSABLE) ×2
K-WIRE 1.6 X 229 MM IMPLANT
K-wire 1.1 x 229 MM ×3 IMPLANT
K-wire 1.6 x 229 MM ×3 IMPLANT
NEEDLE HYPO 22GX1.5 SAFETY (NEEDLE) IMPLANT
NEEDLE HYPO 25X1 1.5 SAFETY (NEEDLE) IMPLANT
NS IRRIG 1000ML POUR BTL (IV SOLUTION) ×3 IMPLANT
PACK BASIN DAY SURGERY FS (CUSTOM PROCEDURE TRAY) ×3 IMPLANT
PAD CAST 3X4 CTTN HI CHSV (CAST SUPPLIES) ×2 IMPLANT
PADDING CAST ABS 3INX4YD NS (CAST SUPPLIES) ×2
PADDING CAST ABS 4INX4YD NS (CAST SUPPLIES) ×2
PADDING CAST ABS COTTON 3X4 (CAST SUPPLIES) ×1 IMPLANT
PADDING CAST ABS COTTON 4X4 ST (CAST SUPPLIES) ×1 IMPLANT
PADDING CAST COTTON 3X4 STRL (CAST SUPPLIES) ×4
SHEET MEDIUM DRAPE 40X70 STRL (DRAPES) IMPLANT
SLEEVE SCD COMPRESS KNEE MED (MISCELLANEOUS) ×3 IMPLANT
SPLINT FIBERGLASS 3X35 (CAST SUPPLIES) ×3 IMPLANT
SPLINT PLASTER CAST XFAST 3X15 (CAST SUPPLIES) IMPLANT
SPLINT PLASTER XTRA FASTSET 3X (CAST SUPPLIES)
STOCKINETTE 4X48 STRL (DRAPES) ×3 IMPLANT
STOCKINETTE SYNTHETIC 3 UNSTER (CAST SUPPLIES) ×3 IMPLANT
SUCTION FRAZIER TIP 10 FR DISP (SUCTIONS) ×3 IMPLANT
SUT BONE WAX W31G (SUTURE) IMPLANT
SUT PROLENE 4 0 P 3 18 (SUTURE) ×3 IMPLANT
SUT PROLENE 4 0 PS 2 18 (SUTURE) ×3 IMPLANT
SUT VIC AB 4-0 P-3 18XBRD (SUTURE) IMPLANT
SUT VIC AB 4-0 P3 18 (SUTURE)
SYR BULB 3OZ (MISCELLANEOUS) ×3 IMPLANT
SYR CONTROL 10ML LL (SYRINGE) IMPLANT
TAPE SURG TRANSPORE 1 IN (GAUZE/BANDAGES/DRESSINGS) ×1 IMPLANT
TAPE SURGICAL TRANSPORE 1 IN (GAUZE/BANDAGES/DRESSINGS) ×2
TOWEL OR 17X24 6PK STRL BLUE (TOWEL DISPOSABLE) ×3 IMPLANT
TOWEL OR NON WOVEN STRL DISP B (DISPOSABLE) ×3 IMPLANT
TUBE CONNECTING 20'X1/4 (TUBING)
TUBE CONNECTING 20X1/4 (TUBING) IMPLANT
UNDERPAD 30X30 INCONTINENT (UNDERPADS AND DIAPERS) ×3 IMPLANT

## 2014-03-14 NOTE — Progress Notes (Signed)
Assisted Dr. Massagee with right, ultrasound guided, supraclavicular block. Side rails up, monitors on throughout procedure. See vital signs in flow sheet. Tolerated Procedure well. 

## 2014-03-14 NOTE — Anesthesia Procedure Notes (Addendum)
Anesthesia Regional Block:  Supraclavicular block  Pre-Anesthetic Checklist: ,, timeout performed, Correct Patient, Correct Site, Correct Laterality, Correct Procedure, Correct Position, site marked, Risks and benefits discussed,  Surgical consent,  Pre-op evaluation,  At surgeon's request and post-op pain management  Laterality: Right and Upper  Prep: chloraprep       Needles:   Needle Type: Echogenic Needle     Needle Length: 9cm 9 cm Needle Gauge: 22 and 22 G  Needle insertion depth: 5 cm   Additional Needles:  Procedures: ultrasound guided (picture in chart) and nerve stimulator Supraclavicular block Narrative:  Start time: 03/14/2014 10:20 AM End time: 03/14/2014 10:35 AM Injection made incrementally with aspirations every 5 mL.  Performed by: Personally  Anesthesiologist: Tmassagee  Additional Notes: Tolerated well   Procedure Name: LMA Insertion Date/Time: 03/14/2014 10:56 AM Performed by: Gar GibbonKEETON, Asuna Peth S Pre-anesthesia Checklist: Patient identified, Emergency Drugs available, Suction available and Patient being monitored Patient Re-evaluated:Patient Re-evaluated prior to inductionOxygen Delivery Method: Circle System Utilized Preoxygenation: Pre-oxygenation with 100% oxygen Intubation Type: IV induction Ventilation: Mask ventilation without difficulty LMA: LMA inserted LMA Size: 4.0 Number of attempts: 1 Airway Equipment and Method: bite block Placement Confirmation: positive ETCO2 Tube secured with: Tape Dental Injury: Teeth and Oropharynx as per pre-operative assessment

## 2014-03-14 NOTE — Op Note (Signed)
See dictation #[id number] Amanda PeaGramig MD

## 2014-03-14 NOTE — Transfer of Care (Signed)
Immediate Anesthesia Transfer of Care Note  Patient: [name]  Procedure(s) Performed: Procedure(s): OPEN REDUCTION INTERNAL FIXATION (ORIF) RIGHT FOURTH AND FIFTH METACARPAL (Right)  Patient Location: PACU  Anesthesia Type:GA combined with regional for post-op pain  Level of Consciousness: awake, sedated and patient cooperative  Airway & Oxygen Therapy: Patient Spontanous Breathing and Patient connected to face mask oxygen  Post-op Assessment: Report given to PACU RN and Post -op Vital signs reviewed and stable  Post vital signs: Reviewed and stable  Complications: No apparent anesthesia complications

## 2014-03-14 NOTE — H&P (Signed)
[name] is an 38 y.o. female.   Chief Complaint: Plan for ORIF right hand as necessary and described. HPI: Plan for ORIF right fifth metacarpal fracture and possible fourth  metacarpal fracture ORIF as necessary she understands all issues risks benefits and plans .Patient presents for evaluation and treatment of the of their upper extremity predicament. The patient denies neck back chest or of abdominal pain. The patient notes that they have no lower extremity problems. The patient from primarily complains of the upper extremity pain noted.  Past Medical History  Diagnosis Date  . TBI (traumatic brain injury) 1999    has slight rt sided weakness  . Metacarpal bone fracture     4th and 5th fingers    Past Surgical History  Procedure Laterality Date  . Cesarean section      x3  . Achilles tendon surgery Bilateral   . Strabismus surgery    . Tracheostomy  1999  . Toe surgery Right   . Endometrial ablation w/ novasure  2001    History reviewed. No pertinent family history. Social History:  reports that she has never smoked. She does not have any smokeless tobacco history on file. She reports that she drinks alcohol. She reports that she does not use illicit drugs.  Allergies: No Known Allergies  Medications Prior to Admission  Medication Sig Dispense Refill  . oxyCODONE-acetaminophen (PERCOCET/ROXICET) 5-325 MG per tablet Take 1 tablet by mouth every 8 (eight) hours as needed for severe pain.  15 tablet  0    Results for orders placed during the hospital encounter of 03/14/14 (from the past 48 hour(s))  POCT HEMOGLOBIN-HEMACUE     Status: None   Collection Time    03/14/14 10:01 AM      Result Value Ref Range   Hemoglobin 14.4  12.0 - 15.0 g/dL   No results found.  Review of Systems  Constitutional: Negative.   HENT: Negative.   Eyes: Negative.   Cardiovascular: Negative.   Genitourinary: Negative.   Neurological: Negative.   Psychiatric/Behavioral: Negative.      Blood pressure 114/75, pulse 66, temperature 98.6 F (37 C), temperature source Oral, resp. rate 16, height 5\' 3"  (1.6 m), weight 72.576 kg (160 lb), SpO2 100.00%. Physical Exam  Patient is right-hand swelling deformity and a obviously displaced fifth metacarpal fracture. She is a fourth metacarpal fracture which is not overly displaced. She is neurovascularly intact. She has no signs of compartment syndrome. Elbow and foreman nontender  The patient is alert and oriented in no acute distress the patient complains of pain in the affected upper extremity.  The patient is noted to have a normal HEENT exam.  Lung fields show equal chest expansion and no shortness of breath  abdomen exam is nontender without distention.  Lower extremity examination does not show any fracture dislocation or blood clot symptoms.  Pelvis is stable neck and back are stable and nontender Assessment/Plan Plan for ORIF right fifth metacarpal. Possible ORIF fourth metacarpal pending stability and evaluation of the third MCP fracture   We are planning surgery for your upper extremity. The risk and benefits of surgery include risk of bleeding infection anesthesia damage to normal structures and failure of the surgery to accomplish its intended goals of relieving symptoms and restoring function with this in mind we'll going to proceed. I have specifically discussed with the patient the pre-and postoperative regime and the does and don'ts and risk and benefits in great detail. Risk and benefits of  surgery also include risk of dystrophy chronic nerve pain failure of the healing process to go onto completion and other inherent risks of surgery The relavent the pathophysiology of the disease/injury process, as well as the alternatives for treatment and postoperative course of action has been discussed in great detail with the patient who desires to proceed.  We will do everything in our power to help you (the patient) restore  function to the upper extremity. Is a pleasure to see this patient today.  Dominica SeverinWilliam Tyquon Near 03/14/2014, 10:35 AM

## 2014-03-14 NOTE — Discharge Instructions (Signed)
You did great. Your operation went very well.  Please keep your bandage clean and dry. You will experience pain after the block were soft and it is very important to prevent pain and swelling to elevate your hand. You may gently move your fingers within the confines of her splint but do not remove your splint. Please call for any problems  Keep bandage clean and dry.  Call for any problems.  No smoking.  Criteria for driving a car: you should be off your pain medicine for 7-8 hours, able to drive one handed(confident), thinking clearly and feeling able in your judgement to drive. Continue elevation as it will decrease swelling.  If instructed by MD move your fingers within the confines of the bandage/splint.  Use ice if instructed by your MD. Call immediately for any sudden loss of feeling in your hand/arm or change in functional abilities of the extremity.    We recommend that you to take vitamin C 1000 mg a day to promote healing we also recommend that if you require her pain medicine that he take a stool softener to prevent constipation as most pain medicines will have constipation side effects. We recommend either Peri-Colace or Senokot and recommend that you also consider adding MiraLAX to prevent the constipation affects from pain medicine if you are required to use them. These medicines are over the counter and maybe purchased at a local pharmacy.  Post Anesthesia Home Care Instructions  Activity: Get plenty of rest for the remainder of the day. A responsible adult should stay with you for 24 hours following the procedure.  For the next 24 hours, DO NOT: -Drive a car -Advertising copywriterperate machinery -Drink alcoholic beverages -Take any medication unless instructed by your physician -Make any legal decisions or sign important papers.  Meals: Start with liquid foods such as gelatin or soup. Progress to regular foods as tolerated. Avoid greasy, spicy, heavy foods. If nausea and/or vomiting occur, drink  only clear liquids until the nausea and/or vomiting subsides. Call your physician if vomiting continues.  Special Instructions/Symptoms: Your throat may feel dry or sore from the anesthesia or the breathing tube placed in your throat during surgery. If this causes discomfort, gargle with warm salt water. The discomfort should disappear within 24 hours. Regional Anesthesia Blocks  1. Numbness or the inability to move the "blocked" extremity may last from 3-48 hours after placement. The length of time depends on the medication injected and your individual response to the medication. If the numbness is not going away after 48 hours, call your surgeon.  2. The extremity that is blocked will need to be protected until the numbness is gone and the  Strength has returned. Because you cannot feel it, you will need to take extra care to avoid injury. Because it may be weak, you may have difficulty moving it or using it. You may not know what position it is in without looking at it while the block is in effect.  3. For blocks in the legs and feet, returning to weight bearing and walking needs to be done carefully. You will need to wait until the numbness is entirely gone and the strength has returned. You should be able to move your leg and foot normally before you try and bear weight or walk. You will need someone to be with you when you first try to ensure you do not fall and possibly risk injury.  4. Bruising and tenderness at the needle site are common side effects  and will resolve in a few days.  5. Persistent numbness or new problems with movement should be communicated to the surgeon or the [facility] [phone])/ [facility] [phone]).

## 2014-03-14 NOTE — Anesthesia Preprocedure Evaluation (Signed)
Anesthesia Evaluation  Patient identified by MRN, date of birth, ID band Patient awake    Reviewed: Allergy & Precautions, H&P , NPO status , Patient's Chart, lab work & pertinent test results  Airway       Dental   Pulmonary  Hx tracheostomy         Cardiovascular negative cardio ROS      Neuro/Psych Hx of traumatic brain injury    GI/Hepatic negative GI ROS, Neg liver ROS,   Endo/Other  negative endocrine ROS  Renal/GU negative Renal ROS     Musculoskeletal   Abdominal   Peds  Hematology negative hematology ROS (+)   Anesthesia Other Findings   Reproductive/Obstetrics                           Anesthesia Physical Anesthesia Plan  ASA: I  Anesthesia Plan:    Post-op Pain Management:    Induction: Intravenous  Airway Management Planned: LMA  Additional Equipment:   Intra-op Plan:   Post-operative Plan: Extubation in OR  Informed Consent: I have reviewed the patients History and Physical, chart, labs and discussed the procedure including the risks, benefits and alternatives for the proposed anesthesia with the patient or authorized representative who has indicated his/her understanding and acceptance.   Dental advisory given  Plan Discussed with: CRNA and Surgeon  Anesthesia Plan Comments:         Anesthesia Quick Evaluation

## 2014-03-15 NOTE — Op Note (Signed)
[name], [name]                ACCOUNT NO.:  [id number]  MEDICAL RECORD NO.:  [id number]  LOCATION:                               FACILITY:  [facility]  PHYSICIAN:  [name], M.D.DATE OF BIRTH:  04/13/1976  DATE OF PROCEDURE:  03/14/2014 DATE OF DISCHARGE:  03/14/2014                              OPERATIVE REPORT   PREOPERATIVE DIAGNOSES:  Displaced fourth and fifth metacarpal fractures, right hand, with associated metacarpophalangeal/proximal phalanx fracture, right middle finger, ulnar aspect at the joint line.  POSTOPERATIVE DIAGNOSES:  Displaced fourth and fifth metacarpal fractures, right hand, with associated metacarpophalangeal/proximal phalanx fracture, right middle finger, ulnar aspect at the joint line.  PROCEDURES: 1. Evaluation under anesthesia, right hand. 2. Open reduction and internal fixation, fifth metacarpal fracture     with intramedullary rodding technique. 3. Open reduction and internal fixation, fourth metacarpal fracture     with intramedullary rod technique, right hand fourth metacarpal. 4. Closed treatment with manipulation, metacarpophalangeal, intra-     articular fracture, right middle finger. 5. Four view x-ray series, right hand, performed and interpreted by     myself.  SURGEON:  Alicia AnoWilliam M. Alicia PeaGramig, MD.  ASSISTANT:  None.  COMPLICATIONS:  None.  ANESTHESIA:  General, preoperative block by Burna FortsJames T. Massagee, M.D.  COMPLICATIONS:  None.  TOURNIQUET TIME:  Less than hour.  INDICATIONS:  Pleasant female presents with the above-mentioned diagnoses.  She has comminuted fracture.  This was sustained days ago. I have discussed the risks, benefits, do's and don'ts, timeframe/duration of recovery.  She desires to proceed.  She understands risks of dystrophy, need for hardware removal, chronic pain, stiffness, etc.  With this in mind, we will proceed.  DESCRIPTION OF PROCEDURE:  The patient was seen by myself and Anesthesia, taken to the  operative suite, and underwent a smooth induction of general anesthetic.  Preoperative block was placed by Dr. Sharee Holstererry Massagee.  Time-out had been called.  Check list complete.  Antibiotics given in the form of Ancef.  She was prepped and draped in the usual sterile fashion.  Evaluation under anesthesia revealed excellent stability about the wrist.  Highly comminuted features about the fourth and fifth metacarpal fractures.  Her finger splay was slightly abnormal on the ring and small finger.  At this time, transverse incision was made at the base of the [facility] to the fourth and fifth regions.  Dissection was carried down.  The extensor digiti minimi was carefully protected, swept out of harm's way, and I then made a pilot hole at the base of the metacarpal (fifth metacarpal).  This pilot hole was made and following this, I pre-bent a 0.062 balloon in the K-wire and performed reduction and placement of the K-wire in an IM fashion to perform ORIF.  This was able to recreate the normal anatomy, height, splay, and integrity of the small finger.  Following this under live x-ray, I noted that the ring finger was certainly in a precarious state and would likely go on to progressive collapse, thus I made a pilot hole at the base of the fourth metacarpal, carefully protecting neurovascular structures and tendons and placed a blunt and did pre-bent by myself  0.045 K-wire down the shaft across the fracture site to engage.  The K-wires were bent and pre-bent I should note without difficulty.  The ends of the K-wires were clipped with the beveled/sharp edges placed against the hamate bone so as not to impinge or protrude against the tendon architecture.  I performed intraoperative x-rays at this time, which looked excellent.  AP, lateral, and multiple oblique views were taken and interpreted by myself.  I was pleased with the height and angulation.  The pins did not protrude through the  bone. The pins looked excellent.  I was pleased with this.  Under live x-ray and fluoro, the patient had excellent stability.  Following this, I performed evaluation under anesthesia of the MCP joint which looked just fine.  There were no gross problems with the alignment and so I did not perform any aggressive reconstruction here.  She was cleansed with saline, irrigated, wound closed with hemostatic control, being secured with bipolar electrocautery, and a volar splint was applied.  She will be given an additional gram of Ancef in the recovery room.  Discharged home and follow up in the office in 10 to 14 days.  These notes have been discussed and all questions have been encouraged and answered.  I will go ahead and cast her from weeks 2 to 4.  After 4 weeks, we will put her in a removable brace and begin range of motion gently at 6 to 8 weeks strengthening if she is demonstrating signs of radiographic healing.  It was pleasure to see her today and participate in her postop recovery and algorithm of course.  I should note these were highly comminuted fractures with some degree of significant fragility about the bones themselves.  I will monitor her closely in the postoperative period.     Elianah Karis M. Rozelle Caudle, M.D.     WMG/MEDQ  D:  03/14/2014  T:  03/15/2014  Job:  [id number] 

## 2014-03-15 NOTE — Op Note (Deleted)
NAMLavon Rocha:  Rocha, [name]                ACCOUNT NO.:  [id number]  MEDICAL RECORD NO.:  [id number]  LOCATION:                               FACILITY:  [facility]  PHYSICIAN:  Dionne AnoWilliam M. Madine Sarr, M.D.DATE OF BIRTH:  04/13/1976  DATE OF PROCEDURE:  03/14/2014 DATE OF DISCHARGE:  03/14/2014                              OPERATIVE REPORT   PREOPERATIVE DIAGNOSES:  Displaced fourth and fifth metacarpal fractures, right hand, with associated metacarpophalangeal/proximal phalanx fracture, right middle finger, ulnar aspect at the joint line.  POSTOPERATIVE DIAGNOSES:  Displaced fourth and fifth metacarpal fractures, right hand, with associated metacarpophalangeal/proximal phalanx fracture, right middle finger, ulnar aspect at the joint line.  PROCEDURES: 1. Evaluation under anesthesia, right hand. 2. Open reduction and internal fixation, fifth metacarpal fracture     with intramedullary rodding technique. 3. Open reduction and internal fixation, fourth metacarpal fracture     with intramedullary rod technique, right hand fourth metacarpal. 4. Closed treatment with manipulation, metacarpophalangeal, intra-     articular fracture, right middle finger. 5. Four view x-ray series, right hand, performed and interpreted by     myself.  SURGEON:  Dionne AnoWilliam M. Amanda PeaGramig, MD.  ASSISTANT:  None.  COMPLICATIONS:  None.  ANESTHESIA:  General, preoperative block by Burna FortsJames T. Massagee, M.D.  COMPLICATIONS:  None.  TOURNIQUET TIME:  Less than hour.  INDICATIONS:  Pleasant female presents with the above-mentioned diagnoses.  She has comminuted fracture.  This was sustained days ago. I have discussed the risks, benefits, do's and don'ts, timeframe/duration of recovery.  She desires to proceed.  She understands risks of dystrophy, need for hardware removal, chronic pain, stiffness, etc.  With this in mind, we will proceed.  DESCRIPTION OF PROCEDURE:  The patient was seen by myself and Anesthesia, taken to the  operative suite, and underwent a smooth induction of general anesthetic.  Preoperative block was placed by Dr. Sharee Holstererry Massagee.  Time-out had been called.  Check list complete.  Antibiotics given in the form of Ancef.  She was prepped and draped in the usual sterile fashion.  Evaluation under anesthesia revealed excellent stability about the wrist.  Highly comminuted features about the fourth and fifth metacarpal fractures.  Her finger splay was slightly abnormal on the ring and small finger.  At this time, transverse incision was made at the base of the [facility] to the fourth and fifth regions.  Dissection was carried down.  The extensor digiti minimi was carefully protected, swept out of harm's way, and I then made a pilot hole at the base of the metacarpal (fifth metacarpal).  This pilot hole was made and following this, I pre-bent a 0.062 balloon in the K-wire and performed reduction and placement of the K-wire in an IM fashion to perform ORIF.  This was able to recreate the normal anatomy, height, splay, and integrity of the small finger.  Following this under live x-ray, I noted that the ring finger was certainly in a precarious state and would likely go on to progressive collapse, thus I made a pilot hole at the base of the fourth metacarpal, carefully protecting neurovascular structures and tendons and placed a blunt and did pre-bent by myself  0.045 K-wire down the shaft across the fracture site to engage.  The K-wires were bent and pre-bent I should note without difficulty.  The ends of the K-wires were clipped with the beveled/sharp edges placed against the hamate bone so as not to impinge or protrude against the tendon architecture.  I performed intraoperative x-rays at this time, which looked excellent.  AP, lateral, and multiple oblique views were taken and interpreted by myself.  I was pleased with the height and angulation.  The pins did not protrude through the  bone. The pins looked excellent.  I was pleased with this.  Under live x-ray and fluoro, the patient had excellent stability.  Following this, I performed evaluation under anesthesia of the MCP joint which looked just fine.  There were no gross problems with the alignment and so I did not perform any aggressive reconstruction here.  She was cleansed with saline, irrigated, wound closed with hemostatic control, being secured with bipolar electrocautery, and a volar splint was applied.  She will be given an additional gram of Ancef in the recovery room.  Discharged home and follow up in the office in 10 to 14 days.  These notes have been discussed and all questions have been encouraged and answered.  I will go ahead and cast her from weeks 2 to 4.  After 4 weeks, we will put her in a removable brace and begin range of motion gently at 6 to 8 weeks strengthening if she is demonstrating signs of radiographic healing.  It was pleasure to see her today and participate in her postop recovery and algorithm of course.  I should note these were highly comminuted fractures with some degree of significant fragility about the bones themselves.  I will monitor her closely in the postoperative period.     Dionne AnoWilliam M. Amanda PeaGramig, M.D.     [facility]  D:  03/14/2014  T:  03/15/2014  Job:  [id number]

## 2014-03-17 ENCOUNTER — Other Ambulatory Visit: Payer: Self-pay | Admitting: Orthopedic Surgery

## 2014-03-18 ENCOUNTER — Encounter (HOSPITAL_BASED_OUTPATIENT_CLINIC_OR_DEPARTMENT_OTHER): Payer: Self-pay | Admitting: Orthopedic Surgery

## 2014-03-18 NOTE — Anesthesia Postprocedure Evaluation (Signed)
  Anesthesia Post-op Note  Patient: [name]  Procedure(s) Performed: Procedure(s): OPEN REDUCTION INTERNAL FIXATION (ORIF) RIGHT FOURTH AND FIFTH METACARPAL (Right)  Patient Location: PACU  Anesthesia Type:GA combined with regional for post-op pain  Level of Consciousness: awake and alert   Airway and Oxygen Therapy: Patient Spontanous Breathing  Post-op Pain: none  Post-op Assessment: Post-op Vital signs reviewed  Post-op Vital Signs: stable  Last Vitals:  Filed Vitals:   03/14/14 1310  BP: 103/64  Pulse: 68  Temp: 36.6 C  Resp: 16    Complications: No apparent anesthesia complications

## 2014-08-07 ENCOUNTER — Other Ambulatory Visit: Payer: Self-pay | Admitting: Obstetrics and Gynecology

## 2014-08-08 LAB — CYTOLOGY - PAP
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
## 2018-02-07 DIAGNOSIS — Z0000 Encounter for general adult medical examination without abnormal findings: Secondary | ICD-10-CM | POA: Diagnosis not present

## 2018-02-14 DIAGNOSIS — R7301 Impaired fasting glucose: Secondary | ICD-10-CM | POA: Diagnosis not present

## 2018-02-14 DIAGNOSIS — Z0000 Encounter for general adult medical examination without abnormal findings: Secondary | ICD-10-CM | POA: Diagnosis not present

## 2018-02-14 DIAGNOSIS — Z1389 Encounter for screening for other disorder: Secondary | ICD-10-CM | POA: Diagnosis not present

## 2018-02-14 DIAGNOSIS — L680 Hirsutism: Secondary | ICD-10-CM | POA: Diagnosis not present

## 2018-02-14 DIAGNOSIS — Z87820 Personal history of traumatic brain injury: Secondary | ICD-10-CM | POA: Diagnosis not present

## 2020-01-23 ENCOUNTER — Ambulatory Visit: Payer: Self-pay | Attending: Internal Medicine

## 2020-01-23 DIAGNOSIS — Z23 Encounter for immunization: Secondary | ICD-10-CM

## 2020-01-23 NOTE — Progress Notes (Signed)
   Covid-19 Vaccination Clinic  Name:  [name]    MRN: [id number] DOB: [birth year]  01/23/2020  [name] was observed post Covid-19 immunization for 15 minutes without incident. She was provided with Vaccine Information Sheet and instruction to access the V-Safe system.   [name] was instructed to call 911 with any severe reactions post vaccine: Marland Kitchen Difficulty breathing  . Swelling of face and throat  . A fast heartbeat  . A bad rash all over body  . Dizziness and weakness   Immunizations Administered    Name Date Dose VIS Date Route   Pfizer COVID-19 Vaccine 01/23/2020 10:39 AM 0.3 mL 10/18/2019 Intramuscular   Manufacturer: [employer], [employer]   Lot: [id number]   NDC: [address]-2

## 2020-02-17 ENCOUNTER — Ambulatory Visit: Payer: Self-pay | Attending: Internal Medicine

## 2020-02-17 DIAGNOSIS — Z23 Encounter for immunization: Secondary | ICD-10-CM

## 2020-02-17 NOTE — Progress Notes (Signed)
   Covid-19 Vaccination Clinic  Name:  [name]    MRN: [id number] DOB: [birth year]  02/17/2020  [name] was observed post Covid-19 immunization for 15 minutes without incident. She was provided with Vaccine Information Sheet and instruction to access the V-Safe system.   [name] was instructed to call 911 with any severe reactions post vaccine: Marland Kitchen Difficulty breathing  . Swelling of face and throat  . A fast heartbeat  . A bad rash all over body  . Dizziness and weakness   Immunizations Administered    Name Date Dose VIS Date Route   Pfizer COVID-19 Vaccine 02/17/2020 10:26 AM 0.3 mL 10/18/2019 Intramuscular   Manufacturer: [employer], [employer]   Lot: [id number]   NDC: [address]-1

## 2021-03-03 ENCOUNTER — Other Ambulatory Visit: Payer: Self-pay | Admitting: Obstetrics and Gynecology

## 2021-03-03 DIAGNOSIS — R928 Other abnormal and inconclusive findings on diagnostic imaging of breast: Secondary | ICD-10-CM

## 2021-03-24 ENCOUNTER — Other Ambulatory Visit: Payer: Self-pay

## 2021-11-30 DIAGNOSIS — H18212 Corneal edema secondary to contact lens, left eye: Secondary | ICD-10-CM | POA: Diagnosis not present

## 2021-12-17 DIAGNOSIS — R7301 Impaired fasting glucose: Secondary | ICD-10-CM | POA: Diagnosis not present

## 2021-12-17 DIAGNOSIS — Z0000 Encounter for general adult medical examination without abnormal findings: Secondary | ICD-10-CM | POA: Diagnosis not present

## 2021-12-17 DIAGNOSIS — R7989 Other specified abnormal findings of blood chemistry: Secondary | ICD-10-CM | POA: Diagnosis not present

## 2021-12-24 DIAGNOSIS — Z1331 Encounter for screening for depression: Secondary | ICD-10-CM | POA: Diagnosis not present

## 2021-12-24 DIAGNOSIS — R82998 Other abnormal findings in urine: Secondary | ICD-10-CM | POA: Diagnosis not present

## 2021-12-24 DIAGNOSIS — Z1339 Encounter for screening examination for other mental health and behavioral disorders: Secondary | ICD-10-CM | POA: Diagnosis not present

## 2021-12-24 DIAGNOSIS — R7301 Impaired fasting glucose: Secondary | ICD-10-CM | POA: Diagnosis not present

## 2021-12-24 DIAGNOSIS — Z0000 Encounter for general adult medical examination without abnormal findings: Secondary | ICD-10-CM | POA: Diagnosis not present

## 2021-12-24 DIAGNOSIS — Z23 Encounter for immunization: Secondary | ICD-10-CM | POA: Diagnosis not present

## 2022-01-14 DIAGNOSIS — R471 Dysarthria and anarthria: Secondary | ICD-10-CM | POA: Diagnosis not present

## 2022-01-14 DIAGNOSIS — Z87891 Personal history of nicotine dependence: Secondary | ICD-10-CM | POA: Diagnosis not present

## 2022-01-14 DIAGNOSIS — R053 Chronic cough: Secondary | ICD-10-CM | POA: Diagnosis not present

## 2022-01-14 DIAGNOSIS — Z87820 Personal history of traumatic brain injury: Secondary | ICD-10-CM | POA: Diagnosis not present

## 2022-02-08 ENCOUNTER — Encounter: Payer: Self-pay | Admitting: Pulmonary Disease

## 2022-02-08 ENCOUNTER — Ambulatory Visit (INDEPENDENT_AMBULATORY_CARE_PROVIDER_SITE_OTHER): Payer: BC Managed Care – PPO | Admitting: Pulmonary Disease

## 2022-02-08 VITALS — BP 110/70 | HR 64 | Temp 98.1°F | Ht 64.0 in | Wt 188.0 lb

## 2022-02-08 DIAGNOSIS — R053 Chronic cough: Secondary | ICD-10-CM | POA: Diagnosis not present

## 2022-02-08 DIAGNOSIS — R1310 Dysphagia, unspecified: Secondary | ICD-10-CM
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
## 2022-02-08 NOTE — Patient Instructions (Addendum)
?  Chronic cough ?--ORDER Speech for swallow study ?--Referral to GI ?--Consider bronchoscopy if negative work-up. Can potentially arrange same-day bronchoscopy if GI plans to do EGD ?--Reflux: START omeprazole 40 mg once a day. Buy over-the-counter ? ?Follow-up with me in 3 months ?

## 2022-02-10 ENCOUNTER — Other Ambulatory Visit (HOSPITAL_COMMUNITY): Payer: Self-pay

## 2022-02-10 DIAGNOSIS — R1310 Dysphagia, unspecified: Secondary | ICD-10-CM

## 2022-02-21 ENCOUNTER — Ambulatory Visit (HOSPITAL_COMMUNITY)
Admission: RE | Admit: 2022-02-21 | Discharge: 2022-02-21 | Disposition: A | Discharge status: Home / Self Care | Payer: BC Managed Care – PPO | Source: Ambulatory Visit | Attending: Pulmonary Disease | Admitting: Pulmonary Disease

## 2022-02-21 DIAGNOSIS — R1310 Dysphagia, unspecified: Secondary | ICD-10-CM

## 2022-02-21 DIAGNOSIS — R053 Chronic cough: Secondary | ICD-10-CM

## 2022-02-21 NOTE — Progress Notes (Signed)
Modified Barium Swallow Progress Note ? ?Patient Details  ?Name: [name] ?MRN: [id number] ?Date of Birth: [birth year] ? ?Today's Date: 02/21/2022 ? ?Modified Barium Swallow completed.  Full report located under Chart Review in the Imaging Section. ? ?Brief recommendations include the following: ? ?Clinical Impression ? Pt demonstrates normal orpharyngeal swallowing ability. There is no dysphagia present, no coughing during study. Esophageal sweep with barium tablet appeared WNL though no radiologist present to confirm. 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 Pt may benefit from f/u with SLP for both interventions targeting cough suppression and/or vocal hygiene and voice therapy. ?  ?Swallow Evaluation Recommendations ? ? Recommended Consults: Consider ENT evaluation ? ?   ? ? Liquid Administration via: Cup;Straw ? ? Medication Administration: Whole meds with liquid ? ? Supervision: Patient able to self feed ? ?   ? ? Postural Changes: Seated upright at 90 degrees ? ?   ? ?   ? ?Harlon Ditty, MA CCC-SLP  ?Acute Rehabilitation Services ?Secure Chat Preferred ?Office [phone] ? ? ?Daphane Odekirk, Riley Nearing ?02/21/2022,2:13 PM ?

## 2022-03-22 DIAGNOSIS — K219 Gastro-esophageal reflux disease without esophagitis: Secondary | ICD-10-CM | POA: Diagnosis not present

## 2022-03-22 DIAGNOSIS — R053 Chronic cough: Secondary | ICD-10-CM | POA: Diagnosis not present

## 2022-03-22 DIAGNOSIS — R490 Dysphonia: Secondary | ICD-10-CM | POA: Diagnosis not present

## 2022-04-01 ENCOUNTER — Encounter: Payer: Self-pay | Admitting: Internal Medicine

## 2022-05-13 ENCOUNTER — Ambulatory Visit (INDEPENDENT_AMBULATORY_CARE_PROVIDER_SITE_OTHER): Payer: BC Managed Care – PPO | Admitting: Internal Medicine

## 2022-05-13 ENCOUNTER — Encounter: Payer: Self-pay | Admitting: Internal Medicine

## 2022-05-13 VITALS — BP 108/80 | HR 64 | Ht 63.0 in | Wt 189.5 lb

## 2022-05-13 DIAGNOSIS — K219 Gastro-esophageal reflux disease without esophagitis: Secondary | ICD-10-CM | POA: Diagnosis not present

## 2022-05-13 DIAGNOSIS — R1310 Dysphagia, unspecified: Secondary | ICD-10-CM

## 2022-05-13 NOTE — Progress Notes (Signed)
Chief Complaint: Dysphagia, cough  HPI : 46 year old female with history of TBI, siezures, headaches presents with dysphagia and cough  Endorses dysphagia that occurs in the middle of the chest that has been present over the last year. This has not worsened over time. Endorses dysphagia to mostly solids, but she will also have dysphagia to liquids and even swallowing her own spit on occasion. She has to chin tuck to help foods get down. Denies prior EGD. She had ENT and speech therapy evaluations that were fairly unremarkable. She was told that she could get treatment for her dysphonia. Endorses chest burning and regurgitation rarely. She has used Tums PRN for acid reflux. Endorses cough after her TBI over 20 years ago. Denies N&V, weight loss, diarrhea, constipation, hematochezia, melena. Denies fam hx of GI issues. Patient states that she was supposed to be put on PPI therapy after her last pulmonology appt, but the medication was never sent in.  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   has slight rt sided weakness   Past Surgical History:  Procedure Laterality Date   ACHILLES TENDON SURGERY Bilateral    BRAIN SURGERY     Stent placed and removed   CESAREAN SECTION     x3   ENDOMETRIAL ABLATION W/ NOVASURE  11/08/1999   OPEN REDUCTION INTERNAL FIXATION (ORIF) METACARPAL Right 03/14/2014   Procedure: OPEN REDUCTION INTERNAL FIXATION (ORIF) RIGHT FOURTH AND FIFTH METACARPAL;  Surgeon: Dominica Severin, MD;  Location: [facility];  Service: Orthopedics;  Laterality: Right;   STRABISMUS SURGERY     TOE SURGERY Right    TRACHEOSTOMY  11/07/1997   Family History  Problem Relation Age of Onset   Breast cancer Mother    Emphysema Mother    Osteoporosis Mother    Diabetes Father        infectious pancreas   Lung cancer Father    Heart  attack Father    Polycystic ovary syndrome Sister    Breast cancer Maternal Grandmother    Stroke Maternal Grandfather    Pancreatic cancer Paternal Grandmother    Social History   Tobacco Use   Smoking status: Former    Types: Cigarettes    Quit date: 1999    Years since quitting: 24.5   Smokeless tobacco: Never   Tobacco comments:    Some college years  Vaping Use   Vaping Use: Never used  Substance Use Topics   Alcohol use: Yes    Comment: social, 1 per week   Drug use: No   Current Outpatient Medications  Medication Sig Dispense Refill   naproxen sodium (ALEVE) 220 MG tablet Take 440 mg by mouth as needed.     No current facility-administered medications for this visit.   No Known Allergies   Review of Systems: All systems reviewed and negative except where noted in HPI.   Physical Exam: BP 108/80 (BP Location: Left Arm, Patient Position: Sitting, Cuff Size: Normal)   Pulse 64   Ht 5\' 3"  (1.6 m) Comment: height measured without shoes  Wt 189 lb 8 oz (86 kg)   BMI 33.57 kg/m  Constitutional: Pleasant,well-developed, female in no acute distress. HEENT: Normocephalic and atraumatic. Conjunctivae are normal. No scleral icterus. Prior tracheostomy scar present. Cardiovascular: Normal rate, regular rhythm.  Pulmonary/chest: Effort normal and  breath sounds normal. No wheezing, rales or rhonchi. Abdominal: Soft, nondistended, nontender. Bowel sounds active throughout. There are no masses palpable. No hepatomegaly. Extremities: No edema Neurological: Alert and oriented to person place and time. Skin: Skin is warm and dry. No rashes noted. Psychiatric: Normal mood and affect. Behavior is normal.  ENT 01/14/22: Normal head neck exam. History of tracheostomy may be irrelevant. Recommend evaluation by a pulmonologist for chronic cough. She might benefit with flexible bronchoscopy to evaluate the tracheostomy site. Follow-up as needed.  Speech swallow study 02/21/22: Pt  demonstrates normal orpharyngeal swallowing ability. There is no dysphagia present, no coughing during study. Esophageal sweep with barium tablet appeared WNL though no radiologist present to confirm. 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 Pt may benefit from f/u with SLP for both interventions targeting cough suppression and/or vocal hygiene and voice therapy.  ASSESSMENT AND PLAN: Dysphagia GERD Patient presents with dysphagia for the last year.  She has had normal ENT and speech therapy evaluations thus far.  We will plan to proceed with EGD for evaluation.  Patient does have a history of underlying GERD so she may be at risk for development of esophageal strictures.  Alternatively could consider an esophageal motility disorder if the EGD is unrevealing for a source of dysphagia. - GERD handout - EGD [facility] - Patient would prefer to wait for EGD procedure before a PPI trial  Alicia Pont, MD

## 2022-05-13 NOTE — Patient Instructions (Addendum)
You have been scheduled for an endoscopy. Please follow written instructions given to you at your visit today. If you use inhalers (even only as needed), please bring them with you on the day of your procedure.   If you are age 44 or older, your body mass index should be between 23-30. Your Body mass index is 33.57 kg/m. If this is out of the aforementioned range listed, please consider follow up with your Primary Care Provider.  If you are age 38 or younger, your body mass index should be between 19-25. Your Body mass index is 33.57 kg/m. If this is out of the aformentioned range listed, please consider follow up with your Primary Care Provider.   ________________________________________________________  The [facility] providers would like to encourage you to use [facility] to communicate with providers for non-urgent requests or questions.  Due to long hold times on the telephone, sending your provider a message by [facility] may be a faster and more efficient way to get a response.  Please allow 48 business hours for a response.  Please remember that this is for non-urgent requests.  _______________________________________________________   Due to recent changes in healthcare laws, you may see the results of your imaging and laboratory studies on MyChart before your provider has had a chance to review them.  We understand that in some cases there may be results that are confusing or concerning to you. Not all laboratory results come back in the same time frame and the provider may be waiting for multiple results in order to interpret others.  Please give [place] 48 hours in order for your provider to thoroughly review all the results before contacting the office for clarification of your results.    Thank you for entrusting me with your care and choosing [facility].  Dr.Dorsey

## 2022-05-23 ENCOUNTER — Ambulatory Visit (INDEPENDENT_AMBULATORY_CARE_PROVIDER_SITE_OTHER): Payer: BC Managed Care – PPO | Admitting: Pulmonary Disease

## 2022-05-23 ENCOUNTER — Encounter: Payer: Self-pay | Admitting: Pulmonary Disease

## 2022-05-23 VITALS — BP 112/74 | HR 65 | Temp 97.9°F | Ht 63.75 in | Wt 195.0 lb

## 2022-05-23 DIAGNOSIS — R053 Chronic cough: Secondary | ICD-10-CM | POA: Diagnosis not present

## 2022-05-23 DIAGNOSIS — R490 Dysphonia: Secondary | ICD-10-CM | POA: Diagnosis not present

## 2022-05-23 NOTE — Patient Instructions (Addendum)
Chronic cough --Scheduled for GI for EGD on 06/09/22 --Reflux: START omeprazole 40 mg once a day x 8 weeks. Buy over-the-counter --If symptoms persist, then would recommend pulmonary function testing  Follow-up with me in 2 months

## 2022-05-23 NOTE — Progress Notes (Signed)
Subjective:   PATIENT ID: [name] GENDER: female DOB: 04/20/1976, MRN: [id number]   HPI  Chief Complaint  Patient presents with   Follow-up    Cough unchanged     Reason for Visit: Follow-up  Ms. [name] is a 46 year old female former smoker with history of TBI in 1999 s/p tracheostomy s/p decannulation after 3 months who presents for follow-up choking/chronic cough.  Initial consult She was seen by Dr. Pollyann Kennedy, ENT.  Note from 01/14/2022 reviewed.  Patient with chronic cough that has worsened. Evaluation for chronic cough and evaluation if flexible bronchoscopy needed for tracheostomy site.   She reports chronic cough >2 years ago. Worsens when drinking and eating. Cough is reflexive when she swallows saliva. Reports food feels like it gets stuck and when she coughs she has relief when massaging her neck. Denies reflux. Cough occurs at night but does not seem any worse during the day. Denies wheezing or shortness of breath. She is active with [employer], she notices her breathing is noisy like a "bronchitis" breath. Reports seasonal allergies with as needed OTC allergy medication.   Mother present and provided additional history. She denies history of prolonged endotracheal tube intubation which "almost immediate" tracheostomy after patient was determined to have TBI.  05/23/22 Since our last visit she was seen by Speech and GI. Swallow evaluation was normal and she is currently scheduled for EGD for evaluation of GERD/dysphagia. Since our last visit she has not started omeprazole. She continues to have unchanged cough and severe hoarseness. Sometimes has burning in chest. She has noticed when she eat she feels like she has a brick in her throat. She has shortness of breath and occasional wheezing with running.  Social History: Former smoker. 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   has slight rt sided weakness     Family History  Problem Relation Age of Onset   Breast cancer Mother    Emphysema Mother    Osteoporosis Mother    Diabetes Father        infectious pancreas   Lung cancer Father    Heart attack Father    Polycystic ovary syndrome Sister    Breast cancer Maternal Grandmother    Stroke Maternal Grandfather    Pancreatic cancer Paternal Grandmother     Social History   Occupational History   Occupation: Stay at home mom  Tobacco Use   Smoking status: Former    Types: Cigarettes    Quit date: 1999    Years since quitting: 24.5   Smokeless tobacco: Never   Tobacco comments:    Some college years  Building services engineer Use: Never used  Substance and Sexual Activity   Alcohol use: Yes    Comment: social, 1 per week   Drug use: No   Sexual activity: Yes    Birth control/protection: Surgical    No Known Allergies   Outpatient Medications Prior to Visit  Medication Sig Dispense Refill   naproxen sodium (ALEVE) 220 MG tablet Take 440 mg by mouth as needed.     No facility-administered medications prior to visit.    Review of Systems  Constitutional:  Negative for chills, diaphoresis, fever, malaise/fatigue and weight loss.  HENT:  Negative  for congestion.        Hoarseness  Respiratory:  Positive for cough, shortness of breath and wheezing. Negative for hemoptysis and sputum production.   Cardiovascular:  Negative for chest pain, palpitations and leg swelling.     Objective:   Vitals:   05/23/22 1411  BP: 112/74  Pulse: 65  Temp: 97.9 F (36.6 C)  TempSrc: Oral  SpO2: 96%  Weight: 195 lb (88.5 kg)  Height: 5' 3.75" (1.619 m)   SpO2: 96 % O2 Device: None (Room air)  Physical Exam: General: Well-appearing, no acute distress HENT: [place], AT, hoarse voice quality Eyes: EOMI, no scleral icterus Respiratory: Clear to auscultation bilaterally.  No  crackles, wheezing or rales Cardiovascular: RRR, -M/R/G, no JVD Extremities:-Edema,-tenderness Neuro: AAO x4, CNII-XII grossly intact Psych: Normal mood, normal affect   Data Reviewed:  Imaging: CXR 03/10/2014-no acute cardiopulmonary disease  PFT: None on file  Labs: CBC    Component Value Date/Time   WBC 8.1 02/04/2010 0520   RBC 2.91 (L) 02/04/2010 0520   HGB 14.4 03/14/2014 1001   HCT 28.8 (L) 02/04/2010 0520   PLT (L) 02/04/2010 0520    98 PLATELET COUNT CONFIRMED BY SMEAR LARGE PLATELETS PRESENT   MCV 98.9 02/04/2010 0520   MCHC 34.9 02/04/2010 0520   RDW 14.1 02/04/2010 0520      Assessment & Plan:   Discussion: 46 year old female former smoker with hx of TBI in 1999 s/p tracheostomy s/p decannulation after 3 months who presents for follow-up for chronic cough. Longstanding cough that began 10 years post-decannulation, so doubt delayed tracheal complications that is related without ruling out more common causes. Initially seen by ENT. Speech normal. Offered to order PFTs to rule out underlying lung disease but after discussion will hold until EGD completed. Recommend PPI  Chronic cough --Scheduled for GI for EGD on 06/09/22 --Reflux: START omeprazole 40 mg once a day x 8 weeks. Buy over-the-counter   Health Maintenance Immunization History  Administered Date(s) Administered   Influenza Split 09/08/2011, 11/13/2013   Influenza, Quadrivalent, Recombinant, Inj, Pf 12/24/2021   PFIZER(Purple Top)SARS-COV-2 Vaccination 01/23/2020, 02/17/2020   Pneumococcal Polysaccharide-23 09/08/2011   Td 09/08/2011   Tdap 12/24/2021   Zoster, Live 09/08/2011    No orders of the defined types were placed in this encounter. No orders of the defined types were placed in this encounter.   Return in about 2 months (around 07/24/2022).  I have spent a total time of 25-minutes on the day of the appointment including chart review, data review, collecting history, coordinating care and  discussing medical diagnosis and plan with the patient/family. Past medical history, allergies, medications were reviewed. Pertinent imaging, labs and tests included in this note have been reviewed and interpreted independently by me.  Alicia Dillen Mechele Collin, MD [facility] 05/23/2022 2:43 PM  Office Number [phone]

## 2022-05-26 DIAGNOSIS — X58XXXS Exposure to other specified factors, sequela: Secondary | ICD-10-CM | POA: Diagnosis not present

## 2022-05-26 DIAGNOSIS — R471 Dysarthria and anarthria: Secondary | ICD-10-CM | POA: Diagnosis not present

## 2022-05-26 DIAGNOSIS — Z6832 Body mass index (BMI) 32.0-32.9, adult: Secondary | ICD-10-CM | POA: Diagnosis not present

## 2022-05-26 DIAGNOSIS — S069XAS Unspecified intracranial injury with loss of consciousness status unknown, sequela: Secondary | ICD-10-CM | POA: Diagnosis not present

## 2022-05-26 DIAGNOSIS — R490 Dysphonia: Secondary | ICD-10-CM | POA: Diagnosis not present

## 2022-05-26 DIAGNOSIS — Z124 Encounter for screening for malignant neoplasm of cervix: Secondary | ICD-10-CM | POA: Diagnosis not present

## 2022-05-26 DIAGNOSIS — Z1231 Encounter for screening mammogram for malignant neoplasm of breast: Secondary | ICD-10-CM | POA: Diagnosis not present

## 2022-05-26 DIAGNOSIS — Z9889 Other specified postprocedural states: Secondary | ICD-10-CM | POA: Diagnosis not present

## 2022-05-26 DIAGNOSIS — R053 Chronic cough: Secondary | ICD-10-CM | POA: Diagnosis not present

## 2022-05-26 DIAGNOSIS — Z01419 Encounter for gynecological examination (general) (routine) without abnormal findings: Secondary | ICD-10-CM | POA: Diagnosis not present

## 2022-06-09 ENCOUNTER — Encounter: Payer: Self-pay | Admitting: Internal Medicine

## 2022-06-09 ENCOUNTER — Ambulatory Visit (AMBULATORY_SURGERY_CENTER): Payer: BC Managed Care – PPO | Admitting: Internal Medicine

## 2022-06-09 VITALS — BP 114/69 | HR 48 | Temp 98.4°F | Resp 16 | Ht 63.0 in | Wt 189.0 lb

## 2022-06-09 DIAGNOSIS — R12 Heartburn: Secondary | ICD-10-CM | POA: Diagnosis not present

## 2022-06-09 DIAGNOSIS — K200 Eosinophilic esophagitis: Secondary | ICD-10-CM

## 2022-06-09 DIAGNOSIS — R1310 Dysphagia, unspecified: Secondary | ICD-10-CM | POA: Diagnosis not present

## 2022-06-09 DIAGNOSIS — K2980 Duodenitis without bleeding: Secondary | ICD-10-CM

## 2022-06-09 DIAGNOSIS — K2289 Other specified disease of esophagus: Secondary | ICD-10-CM | POA: Diagnosis not present

## 2022-06-09 MED ORDER — SODIUM CHLORIDE 0.9 % IV SOLN: 500.0000 mL | Freq: Once | INTRAVENOUS | Status: DC

## 2022-06-09 MED ORDER — FLUCONAZOLE 100 MG PO TABS: 100.0000 mg | ORAL_TABLET | Freq: Every day | ORAL | 0 refills | Status: DC

## 2022-06-09 MED ORDER — OMEPRAZOLE 40 MG PO CPDR: 40.0000 mg | DELAYED_RELEASE_CAPSULE | Freq: Two times a day (BID) | ORAL | 1 refills | Status: DC

## 2022-06-09 NOTE — Patient Instructions (Signed)
Await biopsy results.  YOU HAD AN ENDOSCOPIC PROCEDURE TODAY AT THE [facility]:   Refer to the procedure report that was given to you for any specific questions about what was found during the examination.  If the procedure report does not answer your questions, please call your gastroenterologist to clarify.  If you requested that your care partner not be given the details of your procedure findings, then the procedure report has been included in a sealed envelope for you to review at your convenience later.  YOU SHOULD EXPECT: Some feelings of bloating in the abdomen. Passage of more gas than usual.  Walking can help get rid of the air that was put into your GI tract during the procedure and reduce the bloating. If you had a lower endoscopy (such as a colonoscopy or flexible sigmoidoscopy) you may notice spotting of blood in your stool or on the toilet paper. If you underwent a bowel prep for your procedure, you may not have a normal bowel movement for a few days.  Please Note:  You might notice some irritation and congestion in your nose or some drainage.  This is from the oxygen used during your procedure.  There is no need for concern and it should clear up in a day or so.  SYMPTOMS TO REPORT IMMEDIATELY:  Following upper endoscopy (EGD)  Vomiting of blood or coffee ground material  New chest pain or pain under the shoulder blades  Painful or persistently difficult swallowing  New shortness of breath  Fever of 100F or higher  Black, tarry-looking stools  For urgent or emergent issues, a gastroenterologist can be reached at any hour by calling [phone]. Do not use MyChart messaging for urgent concerns.    DIET:  We do recommend a small meal at first, but then you may proceed to your regular diet.  Drink plenty of fluids but you should avoid alcoholic beverages for 24 hours.  ACTIVITY:  You should plan to take it easy for the rest of today and you should NOT DRIVE or  use heavy machinery until tomorrow (because of the sedation medicines used during the test).    FOLLOW UP: Our staff will call the number listed on your records the next business day following your procedure.  We will call around 7:15- 8:00 am to check on you and address any questions or concerns that you may have regarding the information given to you following your procedure. If we do not reach you, we will leave a message.  If you develop any symptoms (ie: fever, flu-like symptoms, shortness of breath, cough etc.) before then, please call [phone].  If you test positive for Covid 19 in the 2 weeks post procedure, please call and report this information to [place].    If any biopsies were taken you will be contacted by phone or by letter within the next 1-3 weeks.  Please call us at [phone] if you have not heard about the biopsies in 3 weeks.    SIGNATURES/CONFIDENTIALITY: You and/or your care partner have signed paperwork which will be entered into your electronic medical record.  These signatures attest to the fact that that the information above on your After Visit Summary has been reviewed and is understood.  Full responsibility of the confidentiality of this discharge information lies with you and/or your care-partner.

## 2022-06-09 NOTE — Progress Notes (Signed)
VT by DT in adm   No seizure since 1999 x1,not on seizure med

## 2022-06-09 NOTE — Progress Notes (Signed)
GASTROENTEROLOGY PROCEDURE H&P NOTE   Primary Care Physician: Pcp, No    Reason for Procedure:   Dysphagia, GERD  Plan:    EGD  Patient is appropriate for endoscopic procedure(s) in the ambulatory ([facility]) setting.  The nature of the procedure, as well as the risks, benefits, and alternatives were carefully and thoroughly reviewed with the patient. Ample time for discussion and questions allowed. The patient understood, was satisfied, and agreed to proceed.     HPI: [name] is a 46 y.o. female who presents for EGD for evaluation of dysphagia and GERD .  Patient was most recently seen in the Gastroenterology Clinic on 05/13/22.  No interval change in medical history since that appointment. Please refer to that note for full details regarding GI history and clinical presentation.   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   has slight rt sided weakness    Past Surgical History:  Procedure Laterality Date   ACHILLES TENDON SURGERY Bilateral    BRAIN SURGERY     Stent placed and removed   CESAREAN SECTION     x3   ENDOMETRIAL ABLATION W/ NOVASURE  11/08/1999   OPEN REDUCTION INTERNAL FIXATION (ORIF) METACARPAL Right 03/14/2014   Procedure: OPEN REDUCTION INTERNAL FIXATION (ORIF) RIGHT FOURTH AND FIFTH METACARPAL;  Surgeon: Dominica Severin, MD;  Location: [facility];  Service: Orthopedics;  Laterality: Right;   STRABISMUS SURGERY     TOE SURGERY Right    TRACHEOSTOMY  11/07/1997    Prior to Admission medications   Medication Sig Start Date End Date Taking? Authorizing Provider  naproxen sodium (ALEVE) 220 MG tablet Take 440 mg by mouth as needed.    [provider]    Current Outpatient Medications  Medication Sig Dispense Refill   naproxen sodium (ALEVE) 220 MG tablet Take 440 mg by mouth as needed.     No  current facility-administered medications for this visit.    Allergies as of 06/09/2022   (No Known Allergies)    Family History  Problem Relation Age of Onset   Breast cancer Mother    Emphysema Mother    Osteoporosis Mother    Diabetes Father        infectious pancreas   Lung cancer Father    Heart attack Father    Polycystic ovary syndrome Sister    Breast cancer Maternal Grandmother    Stroke Maternal Grandfather    Pancreatic cancer Paternal Grandmother     Social History   Socioeconomic History   Marital status: Married    Spouse name: Not on file   Number of children: 3   Years of education: Not on file   Highest education level: Not on file  Occupational History   Occupation: Stay at home mom  Tobacco Use   Smoking status: Former    Types: Cigarettes    Quit date: 1999    Years since quitting: 24.6   Smokeless tobacco: Never   Tobacco comments:    Some college years  Building services engineer Use: Never used  Substance and Sexual Activity   Alcohol use: Yes    Comment: social, 1 per week   Drug use: No   Sexual activity: Yes    Birth control/protection: Surgical  Other Topics Concern   Not on file  Social History Narrative   Not on file   Social Determinants of Health   Financial Resource Strain: Not on file  Food Insecurity: Not on file  Transportation Needs: Not on file  Physical Activity: Not on file  Stress: Not on file  Social Connections: Not on file  Intimate Partner Violence: Not on file    Physical Exam: Vital signs in last 24 hours: BP 112/65   Pulse (!) 55   Temp 98.4 F (36.9 C) (Skin)   Ht 5\' 3"  (1.6 m)   Wt 189 lb (85.7 kg)   SpO2 98%   BMI 33.48 kg/m  GEN: NAD EYE: Sclerae anicteric ENT: MMM CV: Non-tachycardic Pulm: No increased WOB GI: Soft NEURO:  Alert & Oriented   , MD [facility]   06/09/2022 10:08 AM

## 2022-06-09 NOTE — Op Note (Signed)
[facility] Patient Name: [name] Procedure Date: 06/09/2022 10:46 AM MRN: [id number] Endoscopist: Nicole Kindred "Eulah Pont ,  Age: 46 Referring MD:  Date of Birth: November 16, 1975 Gender: Female Account #: [id number] Procedure:                Upper GI endoscopy Indications:              Dysphagia, Heartburn Medicines:                Monitored Anesthesia Care Procedure:                Pre-Anesthesia Assessment:                           - Prior to the procedure, a History and Physical                            was performed, and patient medications and                            allergies were reviewed. The patient's tolerance of                            previous anesthesia was also reviewed. The risks                            and benefits of the procedure and the sedation                            options and risks were discussed with the patient.                            All questions were answered, and informed consent                            was obtained. Prior Anticoagulants: The patient has                            taken no previous anticoagulant or antiplatelet                            agents. ASA Grade Assessment: II - A patient with                            mild systemic disease. After reviewing the risks                            and benefits, the patient was deemed in                            satisfactory condition to undergo the procedure.                           After obtaining informed consent, the endoscope was  passed under direct vision. Throughout the                            procedure, the patient's blood pressure, pulse, and                            oxygen saturations were monitored continuously. The                            Endoscope was introduced through the mouth, and                            advanced to the second part of duodenum. The upper                            GI endoscopy was accomplished  without difficulty.                            The patient tolerated the procedure well. Scope In: Scope Out: Findings:                 White nummular lesions were noted in the mid                            esophagus. Biopsies were taken with a cold forceps                            for histology.                           A deformity (prior G-tube site) was found in the                            gastric body.                           Biopsies were taken with a cold forceps on the                            greater curvature of the gastric body, on the                            lesser curvature of the gastric body, at the                            incisura, on the greater curvature of the gastric                            antrum and on the lesser curvature of the gastric                            antrum for Helicobacter pylori testing.  Localized severe inflammation characterized by                            congestion (edema) and erythema was found in the                            duodenal bulb. Biopsies were taken with a cold                            forceps for histology. Complications:            No immediate complications. Estimated Blood Loss:     Estimated blood loss was minimal. Impression:               - White nummular lesions in esophageal mucosa.                            Biopsied.                           - Acquired deformity (prior G-tube site) in the                            gastric body.                           - Duodenitis. Biopsied.                           - Biopsies were taken with a cold forceps for                            Helicobacter pylori testing. Recommendation:           - Discharge patient to home (with escort).                           - Await pathology results.                           - Use Prilosec (omeprazole) 40 mg PO BID for 8                            weeks.                           - Fluconazole  400 mg x 1 dose, followed by 200 mg                            daily for 13 days.                           - Return to GI clinic in 6 weeks.                           - The findings and recommendations were discussed  with the patient. Nicole Kindred "Eulah Pont,  06/09/2022 11:09:19 AM

## 2022-06-09 NOTE — Progress Notes (Signed)
Called to room to assist during endoscopic procedure.  Patient ID and intended procedure confirmed with present staff. Received instructions for my participation in the procedure from the performing physician.  

## 2022-06-09 NOTE — Progress Notes (Signed)
Report given to PACU, vss 

## 2022-06-10 ENCOUNTER — Telehealth: Payer: Self-pay | Admitting: *Deleted

## 2022-06-10 NOTE — Telephone Encounter (Signed)
  Follow up Call-     06/09/2022   10:04 AM  Call back number  Post procedure Call Back phone  # [phone]  Permission to leave phone message Yes  comments no voicemail set up     Patient questions:  Do you have a fever, pain , or abdominal swelling? No. Pain Score  0 *  Have you tolerated food without any problems? Yes.    Have you been able to return to your normal activities? Yes.    Do you have any questions about your discharge instructions: Diet   No. Medications  No. Follow up visit  No.  Do you have questions or concerns about your Care? No.  Actions: * If pain score is 4 or above: No action needed, pain <4.

## 2022-06-15 ENCOUNTER — Encounter: Payer: Self-pay | Admitting: Internal Medicine

## 2022-07-19 ENCOUNTER — Ambulatory Visit (INDEPENDENT_AMBULATORY_CARE_PROVIDER_SITE_OTHER): Payer: BC Managed Care – PPO | Admitting: Internal Medicine

## 2022-07-19 ENCOUNTER — Encounter: Payer: Self-pay | Admitting: Internal Medicine

## 2022-07-19 VITALS — BP 134/86 | HR 54 | Ht 63.0 in | Wt 194.1 lb

## 2022-07-19 DIAGNOSIS — R1310 Dysphagia, unspecified: Secondary | ICD-10-CM | POA: Diagnosis not present

## 2022-07-19 DIAGNOSIS — K200 Eosinophilic esophagitis: Secondary | ICD-10-CM

## 2022-07-19 MED ORDER — OMEPRAZOLE 40 MG PO CPDR: 40.0000 mg | DELAYED_RELEASE_CAPSULE | Freq: Two times a day (BID) | ORAL | 1 refills | Status: DC

## 2022-07-19 NOTE — Progress Notes (Signed)
   Chief Complaint: Eosinophilic esophagitis  HPI : 46 year old female with history of TBI, siezures, headaches presents for follow up of EoE  Interval History: She has been taking Prilosec but she often forgets doses. She will still occasionally feel things get caught in her throat. She will have some cough when she swallows her spit. Endorses heartburn every once in a while. She does not think that the fluconazole really helped.   Current Outpatient Medications  Medication Sig Dispense Refill   estradiol (VIVELLE-DOT) 0.05 MG/24HR patch APPLY 1 PATCH TOPICALLY TO THE SKIN 2 TIMES A WEEK     naproxen sodium (ALEVE) 220 MG tablet Take 440 mg by mouth as needed.     omeprazole (PRILOSEC) 40 MG capsule Take 1 capsule (40 mg total) by mouth 2 (two) times daily. Use Prilosec 40mg  twice daily for 8 weeks 60 capsule 1   No current facility-administered medications for this visit.   Review of Systems: All systems reviewed and negative except where noted in HPI.   Physical Exam: BP 134/86   Pulse (!) 54   Ht 5\' 3"  (1.6 m)   Wt 194 lb 2 oz (88.1 kg)   BMI 34.39 kg/m  Constitutional: Pleasant,well-developed, female in no acute distress. HEENT: Normocephalic and atraumatic. Conjunctivae are normal. No scleral icterus. Prior tracheostomy scar present. Cardiovascular: Normal rate, regular rhythm.  Pulmonary/chest: Effort normal and breath sounds normal. No wheezing, rales or rhonchi. Abdominal: Soft, nondistended, nontender. Bowel sounds active throughout. There are no masses palpable. No hepatomegaly. Extremities: No edema Neurological: Alert and oriented to person place and time. Skin: Skin is warm and dry. No rashes noted. Psychiatric: Normal mood and affect. Behavior is normal.  ENT 01/14/22: Normal head neck exam. History of tracheostomy may be irrelevant. Recommend evaluation by a pulmonologist for chronic cough. She might benefit with flexible bronchoscopy to evaluate the tracheostomy  site. Follow-up as needed.  Speech swallow study 02/21/22: Pt demonstrates normal orpharyngeal swallowing ability. There is no dysphagia present, no coughing during study. Esophageal sweep with barium tablet appeared WNL though no radiologist present to confirm. 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 Pt may benefit from f/u with SLP for both interventions targeting cough suppression and/or vocal hygiene and voice therapy.  EGD 06/09/22: - White nummular lesions in esophageal mucosa. Biopsied. - Acquired deformity (prior G-tube site) in the gastric body. - Duodenitis. Biopsied. - Biopsies were taken with a cold forceps for Helicobacter pylori testing. Path: 1. Surgical [P], duodenal - ACTIVE DUODENITIS CONSISTENT WITH PEPTIC INJURY. - NO INCREASED INTRAEPITHELIAL LYMPHOCYTES OR VILLOUS ATROPHY. 2. Surgical [P], gastric - ANTRAL AND OXYNTIC MUCOSA WITH NO SIGNIFICANT PATHOLOGIC CHANGES. - NO HELICOBACTER PYLORI IDENTIFIED. 3. Surgical [P], esophagus - SQUAMOUS MUCOSA WITH INCREASED EOSINOPHILS. - GREATER THAN 20 PER HIGH-POWER FIELD. - PAS STAIN FOR FUNGUS IS NEGATIVE.  ASSESSMENT AND PLAN: Eosinophilic esophagitis Dysphagia GERD Patient recently had an EGD that showed esophageal biopsies confirming a diagnosis of EoE. She was instructed to start PPI BID therapy, but she has had difficulty with remembering to take the medication consistently. I encouraged her to get help from her family to help her with remembering to take the medication. After high dose PPI trial, will plan for EGD to see if her EoE responses to PPI therapy alone. - Prescribed omeprazole 40 mg BID - EGD [facility] in 2 months  02/23/22, MD

## 2022-07-19 NOTE — Patient Instructions (Addendum)
_______________________________________________________  If you are age 44 or older, your body mass index should be between 23-30. Your Body mass index is 34.39 kg/m. If this is out of the aforementioned range listed, please consider follow up with your Primary Care Provider.  If you are age 72 or younger, your body mass index should be between 19-25. Your Body mass index is 34.39 kg/m. If this is out of the aformentioned range listed, please consider follow up with your Primary Care Provider.   ________________________________________________________  The [facility] providers would like to encourage you to use [facility] to communicate with providers for non-urgent requests or questions.  Due to long hold times on the telephone, sending your provider a message by [facility] may be a faster and more efficient way to get a response.  Please allow 48 business hours for a response.  Please remember that this is for non-urgent requests.  _______________________________________________________  We have sent the following medications to your pharmacy for you to pick up at your convenience:  Omeprazole 40mg  one capsule twice daily.  You will need an endoscopy in November.  We will contact you to schedule this appointment.  Thank you for entrusting me with your care and choosing [facility].  Dr [facility]

## 2022-07-25 ENCOUNTER — Other Ambulatory Visit (HOSPITAL_COMMUNITY): Payer: Self-pay

## 2022-07-25 ENCOUNTER — Telehealth: Payer: Self-pay

## 2022-07-25 NOTE — Telephone Encounter (Signed)
Patient Advocate Encounter   Received notification from [facility] that prior authorization is required for Omeprazole 40MG  dr capsules  Submitted: 07-25-2022 Key [id number]

## 2022-07-26 ENCOUNTER — Other Ambulatory Visit: Payer: Self-pay

## 2022-07-26 DIAGNOSIS — R1310 Dysphagia, unspecified: Secondary | ICD-10-CM

## 2022-07-26 DIAGNOSIS — K200 Eosinophilic esophagitis: Secondary | ICD-10-CM

## 2022-07-26 NOTE — Telephone Encounter (Signed)
Spoke with the patient to advise of the [facility] decision. The patient has purchased OTC and is taking 4 pills a day. Patient made aware of [employer] [facility] discount and that the [facility] has the lowest price on the Omeprazole 40 mg tablets. Patient thanks me for this information.

## 2022-07-26 NOTE — Telephone Encounter (Signed)
Patient Advocate Encounter  Received a fax from [facility] regarding Prior Authorization for Omeprazole 40MG  dr capsules.   Authorization has been DENIED due to this benefit plan does not cover any drug that is therapeutically equivalent to an over-the-counter drug where the over-the-counter drug product contains the same active ingredient as the prescription product at the same, or similar, strengths -OR- drugs that are purchased over-the-counter, unless specifically listed as a covered drug in the formulary and a written prescription is provided.

## 2022-07-28 ENCOUNTER — Ambulatory Visit: Payer: BC Managed Care – PPO | Admitting: Pulmonary Disease

## 2022-09-13 ENCOUNTER — Encounter: Payer: Self-pay | Admitting: Internal Medicine

## 2022-09-13 ENCOUNTER — Ambulatory Visit (AMBULATORY_SURGERY_CENTER): Payer: BC Managed Care – PPO | Admitting: Internal Medicine

## 2022-09-13 VITALS — BP 111/65 | HR 55 | Temp 98.6°F | Resp 14 | Ht 63.0 in | Wt 194.0 lb

## 2022-09-13 DIAGNOSIS — K2289 Other specified disease of esophagus: Secondary | ICD-10-CM | POA: Diagnosis not present

## 2022-09-13 DIAGNOSIS — K200 Eosinophilic esophagitis: Secondary | ICD-10-CM

## 2022-09-13 MED ORDER — SODIUM CHLORIDE 0.9 % IV SOLN: 500.0000 mL | Freq: Once | INTRAVENOUS | Status: DC

## 2022-09-13 MED ORDER — DEXLANSOPRAZOLE 60 MG PO CPDR: 60.0000 mg | DELAYED_RELEASE_CAPSULE | Freq: Every day | ORAL | 3 refills | Status: DC

## 2022-09-13 NOTE — Progress Notes (Signed)
Pt resting comfortably. VSS. Airway intact. SBAR complete to RN. All questions answered.   

## 2022-09-13 NOTE — Op Note (Signed)
[facility] Patient Name: [name] Procedure Date: 09/13/2022 10:28 AM MRN: [id number] Endoscopist: Madelyn Brunner Maxwell , , [id number] Age: 46 Referring MD:  Date of Birth: 1976-07-15 Gender: Female Account #: [id number] Procedure:                Upper GI endoscopy Indications:              Follow-up of eosinophilic esophagitis Medicines:                Monitored Anesthesia Care Procedure:                Pre-Anesthesia Assessment:                           - Prior to the procedure, a History and Physical                            was performed, and patient medications and                            allergies were reviewed. The patient's tolerance of                            previous anesthesia was also reviewed. The risks                            and benefits of the procedure and the sedation                            options and risks were discussed with the patient.                            All questions were answered, and informed consent                            was obtained. Prior Anticoagulants: The patient has                            taken no anticoagulant or antiplatelet agents. ASA                            Grade Assessment: II - A patient with mild systemic                            disease. After reviewing the risks and benefits,                            the patient was deemed in satisfactory condition to                            undergo the procedure.                           After obtaining informed consent, the endoscope was  passed under direct vision. Throughout the                            procedure, the patient's blood pressure, pulse, and                            oxygen saturations were monitored continuously. The                            GIF [id number] #[id number] was introduced through the                            mouth, and advanced to the second part of duodenum.                            The upper GI  endoscopy was accomplished without                            difficulty. The patient tolerated the procedure                            well. Scope In: Scope Out: Findings:                 Mucosal changes including ringed esophagus,                            longitudinal furrows and white specks were found in                            the entire esophagus. Esophageal findings were                            graded using the Eosinophilic Esophagitis                            Endoscopic Reference Score (EoE-EREFS) as: Edema                            Grade 0 Normal (distinct vascular markings), Rings                            Grade 1 Mild (subtle circumferential ridges seen on                            esophageal distension), Exudates Grade 2 Severe                            (scattered white lesions involving 10 percent or                            greater of the esophageal surface area), Furrows                            Grade 1 Mild (vertical lines  without visible depth)                            and Stricture none (no stricture found). Biopsies                            were obtained from the proximal and distal                            esophagus with cold forceps for histology of                            suspected eosinophilic esophagitis.                           A deformity was found in the gastric body from                            prior G-tube.                           Localized mild inflammation characterized by                            congestion (edema) and erythema was found in the                            duodenal bulb. Biopsies were taken with a cold                            forceps for histology. Complications:            No immediate complications. Estimated Blood Loss:     Estimated blood loss was minimal. Impression:               - Esophageal mucosal changes suspicious for                            eosinophilic esophagitis.                            - Acquired deformity in the gastric body from prior                            G-tube.                           - Duodenitis. Biopsied.                           - Biopsies were taken with a cold forceps for                            evaluation of eosinophilic esophagitis. Recommendation:           - Discharge patient to home (with escort).                           -  Await pathology results.                           - Switch from omeprazole 40 mg BID to Dexilant 60                            mg QD.                           - The findings and recommendations were discussed                            with the patient.                           - Return to GI clinic in 6 weeks. Dr Georgian Co "Lyndee Leo" Lorenso Courier,  09/13/2022 10:46:44 AM

## 2022-09-13 NOTE — Progress Notes (Signed)
GASTROENTEROLOGY PROCEDURE H&P NOTE   Primary Care Physician: Pcp, No    Reason for Procedure:   Eosinophilic esophagitis  Plan:    EGD  Patient is appropriate for endoscopic procedure(s) in the ambulatory ([facility]) setting.  The nature of the procedure, as well as the risks, benefits, and alternatives were carefully and thoroughly reviewed with the patient. Ample time for discussion and questions allowed. The patient understood, was satisfied, and agreed to proceed.     HPI: [name] is a 46 y.o. female who presents for EGD for evaluation of eosinophilic esophagitis .  Patient was most recently seen in the Gastroenterology Clinic on 07/19/22.  No interval change in medical history since that appointment. She has been taking her omeprazole 40 mg BID consistently for a few days and then will forget for a few days. Please refer to that note for full details regarding GI history and clinical presentation.   Past Medical History:  Diagnosis Date   Allergy    Chronic headaches    Eosinophilic esophagitis    GERD (gastroesophageal reflux disease)    Metacarpal bone fracture    4th and 5th fingers   Seizure (HCC)    TBI (traumatic brain injury) ([place]) 11/07/1997   has slight rt sided weakness    Past Surgical History:  Procedure Laterality Date   ACHILLES TENDON SURGERY Bilateral    BRAIN SURGERY     Stent placed and removed   CESAREAN SECTION     x3   ENDOMETRIAL ABLATION W/ NOVASURE  11/08/1999   OPEN REDUCTION INTERNAL FIXATION (ORIF) METACARPAL Right 03/14/2014   Procedure: OPEN REDUCTION INTERNAL FIXATION (ORIF) RIGHT FOURTH AND FIFTH METACARPAL;  Surgeon: Roseanne Kaufman, MD;  Location: [facility];  Service: Orthopedics;  Laterality: Right;   STRABISMUS SURGERY     TOE SURGERY Right    TRACHEOSTOMY  11/07/1997   UPPER GASTROINTESTINAL ENDOSCOPY      Prior to Admission medications   Medication Sig Start Date End Date Taking? Authorizing Provider   naproxen sodium (ALEVE) 220 MG tablet Take 440 mg by mouth as needed.   Yes [provider]  omeprazole (PRILOSEC) 40 MG capsule Take 1 capsule (40 mg total) by mouth 2 (two) times daily. 07/19/22  Yes Sharyn Creamer, MD  estradiol (VIVELLE-DOT) 0.05 MG/24HR patch APPLY 1 PATCH TOPICALLY TO THE SKIN 2 TIMES A WEEK    [provider]    Current Outpatient Medications  Medication Sig Dispense Refill   naproxen sodium (ALEVE) 220 MG tablet Take 440 mg by mouth as needed.     omeprazole (PRILOSEC) 40 MG capsule Take 1 capsule (40 mg total) by mouth 2 (two) times daily. 60 capsule 1   estradiol (VIVELLE-DOT) 0.05 MG/24HR patch APPLY 1 PATCH TOPICALLY TO THE SKIN 2 TIMES A WEEK     Current Facility-Administered Medications  Medication Dose Route Frequency Provider Last Rate Last Admin   0.9 %  sodium chloride infusion  500 mL Intravenous Once Sharyn Creamer, MD        Allergies as of 09/13/2022   (No Known Allergies)    Family History  Problem Relation Age of Onset   Breast cancer Mother    Emphysema Mother    Osteoporosis Mother    Diabetes Father        infectious pancreas   Lung cancer Father    Heart attack Father    Polycystic ovary syndrome Sister    Breast cancer Maternal Grandmother  Stroke Maternal Grandfather    Pancreatic cancer Paternal Grandmother    Colon cancer Neg Hx    Stomach cancer Neg Hx     Social History   Socioeconomic History   Marital status: Married    Spouse name: Not on file   Number of children: 3   Years of education: Not on file   Highest education level: Not on file  Occupational History   Occupation: Stay at home mom  Tobacco Use   Smoking status: Former    Types: Cigarettes    Quit date: 1999    Years since quitting: 24.8   Smokeless tobacco: Never   Tobacco comments:    Some college years  Building services engineer Use: Never used  Substance and Sexual Activity   Alcohol use: Yes    Comment: social, 1 per week    Drug use: No   Sexual activity: Yes    Birth control/protection: Surgical  Other Topics Concern   Not on file  Social History Narrative   Not on file   Social Determinants of Health   Financial Resource Strain: Not on file  Food Insecurity: Not on file  Transportation Needs: Not on file  Physical Activity: Not on file  Stress: Not on file  Social Connections: Not on file  Intimate Partner Violence: Not on file    Physical Exam: Vital signs in last 24 hours: BP 121/73   Pulse (!) 58   Temp 98.6 F (37 C)   Ht 5\' 3"  (1.6 m)   Wt 194 lb (88 kg)   SpO2 96%   BMI 34.37 kg/m  GEN: NAD EYE: Sclerae anicteric ENT: MMM CV: Non-tachycardic Pulm: No increased WOB GI: Soft NEURO:  Alert & Oriented   , MD [facility]   09/13/2022 9:44 AM

## 2022-09-13 NOTE — Progress Notes (Signed)
Called to room to assist during endoscopic procedure.  Patient ID and intended procedure confirmed with present staff. Received instructions for my participation in the procedure from the performing physician.  

## 2022-09-13 NOTE — Patient Instructions (Signed)
Discharge instructions given. Prescription sent to pharmacy. Resume previous medications. Call office to schedule a return visit for 6 weeks. YOU HAD AN ENDOSCOPIC PROCEDURE TODAY AT [facility]:   Refer to the procedure report that was given to you for any specific questions about what was found during the examination.  If the procedure report does not answer your questions, please call your gastroenterologist to clarify.  If you requested that your care partner not be given the details of your procedure findings, then the procedure report has been included in a sealed envelope for you to review at your convenience later.  YOU SHOULD EXPECT: Some feelings of bloating in the abdomen. Passage of more gas than usual.  Walking can help get rid of the air that was put into your GI tract during the procedure and reduce the bloating. If you had a lower endoscopy (such as a colonoscopy or flexible sigmoidoscopy) you may notice spotting of blood in your stool or on the toilet paper. If you underwent a bowel prep for your procedure, you may not have a normal bowel movement for a few days.  Please Note:  You might notice some irritation and congestion in your nose or some drainage.  This is from the oxygen used during your procedure.  There is no need for concern and it should clear up in a day or so.  SYMPTOMS TO REPORT IMMEDIATELY:   Following upper endoscopy (EGD)  Vomiting of blood or coffee ground material  New chest pain or pain under the shoulder blades  Painful or persistently difficult swallowing  New shortness of breath  Fever of 100F or higher  Black, tarry-looking stools  For urgent or emergent issues, a gastroenterologist can be reached at any hour by calling [phone]. Do not use MyChart messaging for urgent concerns.    DIET:  We do recommend a small meal at first, but then you may proceed to your regular diet.  Drink plenty of fluids but you should avoid  alcoholic beverages for 24 hours.  ACTIVITY:  You should plan to take it easy for the rest of today and you should NOT DRIVE or use heavy machinery until tomorrow (because of the sedation medicines used during the test).    FOLLOW UP: Our staff will call the number listed on your records the next business day following your procedure.  We will call around 7:15- 8:00 am to check on you and address any questions or concerns that you may have regarding the information given to you following your procedure. If we do not reach you, we will leave a message.     If any biopsies were taken you will be contacted by phone or by letter within the next 1-3 weeks.  Please call us at [phone] if you have not heard about the biopsies in 3 weeks.    SIGNATURES/CONFIDENTIALITY: You and/or your care partner have signed paperwork which will be entered into your electronic medical record.  These signatures attest to the fact that that the information above on your After Visit Summary has been reviewed and is understood.  Full responsibility of the confidentiality of this discharge information lies with you and/or your care-partner.

## 2022-09-14 ENCOUNTER — Telehealth: Payer: Self-pay

## 2022-09-14 NOTE — Telephone Encounter (Signed)
  Follow up Call-     09/13/2022    9:34 AM 06/09/2022   10:04 AM  Call back number  Post procedure Call Back phone  # [phone]  Permission to leave phone message Yes Yes  comments  no voicemail set up     Patient questions:  Do you have a fever, pain , or abdominal swelling? No. Pain Score  0 *  Have you tolerated food without any problems? Yes.    Have you been able to return to your normal activities? Yes.    Do you have any questions about your discharge instructions: Diet   No. Medications  No. Follow up visit  No.  Do you have questions or concerns about your Care? No.  Actions: * If pain score is 4 or above: No action needed, pain <4.

## 2022-09-15 ENCOUNTER — Encounter: Payer: Self-pay | Admitting: Internal Medicine

## 2022-09-15 NOTE — Progress Notes (Signed)
Hi [name], please let the patient know that her biopsies showed that she still has active eosinophilic esophagitis. She will need to be either treated with topical steroids such as swallowed fluticasone or a six food elimination diet. Please schedule a GI clinic follow up with me in 4-6 weeks. Thanks.

## 2022-09-16 ENCOUNTER — Telehealth: Payer: Self-pay | Admitting: Internal Medicine

## 2022-09-16 NOTE — Telephone Encounter (Signed)
Patient called stated the Dexilant medication needs PA.

## 2022-09-19 ENCOUNTER — Telehealth: Payer: Self-pay | Admitting: Pharmacy Technician

## 2022-09-19 NOTE — Telephone Encounter (Signed)
Received a fax regarding Prior Authorization from [facility] for DEXLANSOPRAZOLE 60MG . Authorization has been DENIED because PT must try and fail at least one generic PPI (such as pantoprazole or rabeprazole) OR when the member cannot take ALL other generic proton pump inhibitors.  Phone#   Received notification from [employer] that prior authorization for DEXLANSOPRAZOLE 60MG  is required.   PA submitted on 11.10.23 Key [id number]  Status is pending    , CPhT Patient Advocate Phone: [phone]

## 2022-09-19 NOTE — Telephone Encounter (Signed)
Dr Leonides Schanz wants to appeal this decision because the patient was on Omeprazole 40 mg without improvement.

## 2022-09-19 NOTE — Telephone Encounter (Signed)
Do you want to try her on pantoprazole or rabeprazole?

## 2022-10-04 ENCOUNTER — Other Ambulatory Visit (HOSPITAL_COMMUNITY): Payer: Self-pay

## 2022-10-05 DIAGNOSIS — M9902 Segmental and somatic dysfunction of thoracic region: Secondary | ICD-10-CM | POA: Diagnosis not present

## 2022-10-05 DIAGNOSIS — M9901 Segmental and somatic dysfunction of cervical region: Secondary | ICD-10-CM | POA: Diagnosis not present

## 2022-10-05 DIAGNOSIS — M9903 Segmental and somatic dysfunction of lumbar region: Secondary | ICD-10-CM | POA: Diagnosis not present

## 2022-10-05 DIAGNOSIS — M9905 Segmental and somatic dysfunction of pelvic region: Secondary | ICD-10-CM | POA: Diagnosis not present

## 2022-10-25 ENCOUNTER — Encounter: Payer: Self-pay | Admitting: Internal Medicine

## 2022-10-25 ENCOUNTER — Ambulatory Visit (INDEPENDENT_AMBULATORY_CARE_PROVIDER_SITE_OTHER): Payer: BC Managed Care – PPO | Admitting: Internal Medicine

## 2022-10-25 VITALS — BP 136/82 | HR 61 | Ht 63.0 in | Wt 193.4 lb

## 2022-10-25 DIAGNOSIS — K219 Gastro-esophageal reflux disease without esophagitis: Secondary | ICD-10-CM

## 2022-10-25 DIAGNOSIS — K200 Eosinophilic esophagitis: Secondary | ICD-10-CM

## 2022-10-25 DIAGNOSIS — R1310 Dysphagia, unspecified: Secondary | ICD-10-CM

## 2022-10-25 MED ORDER — DUPILUMAB 300 MG/2ML ~~LOC~~ SOSY: 300.0000 mg | PREFILLED_SYRINGE | SUBCUTANEOUS | 2 refills | Status: DC

## 2022-10-25 NOTE — Progress Notes (Signed)
Chief Complaint: Eosinophilic esophagitis  HPI : 46 year old female with history of TBI, siezures, headaches presents for follow up of EoE  Interval History: Patient has had an easier time with taking the Dexilant medication since it is only once a day. She has been getting this medication through [facility]. She still has occasional issues with swallowing bread. Mother has eosinophilic esophagitis.   Wt Readings from Last 3 Encounters:  10/25/22 193 lb 6 oz (87.7 kg)  09/13/22 194 lb (88 kg)  07/19/22 194 lb 2 oz (88.1 kg)   Current Outpatient Medications  Medication Sig Dispense Refill   dexlansoprazole (DEXILANT) 60 MG capsule Take 1 capsule (60 mg total) by mouth daily. 90 capsule 3   naproxen sodium (ALEVE) 220 MG tablet Take 440 mg by mouth as needed.     estradiol (VIVELLE-DOT) 0.05 MG/24HR patch APPLY 1 PATCH TOPICALLY TO THE SKIN 2 TIMES A WEEK (Patient not taking: Reported on 10/25/2022)     No current facility-administered medications for this visit.   Review of Systems: All systems reviewed and negative except where noted in HPI.   Physical Exam: BP 136/82 (BP Location: Left Arm, Patient Position: Sitting)   Pulse 61   Ht 5\' 3"  (1.6 m)   Wt 193 lb 6 oz (87.7 kg)   SpO2 96%   BMI 34.25 kg/m  Constitutional: Pleasant,well-developed, female in no acute distress. HEENT: Normocephalic and atraumatic. Conjunctivae are normal. No scleral icterus. Prior tracheostomy scar present. Cardiovascular: Normal rate, regular rhythm.  Pulmonary/chest: Effort normal and breath sounds normal. No wheezing, rales or rhonchi. Abdominal: Soft, nondistended, nontender. Bowel sounds active throughout. There are no masses palpable. No hepatomegaly. Extremities: No edema Neurological: Alert and oriented to person place and time. Skin: Skin is warm and dry. No rashes noted. Psychiatric: Normal mood and affect. Behavior is normal.  ENT 01/14/22: Normal head neck exam. History of tracheostomy  may be irrelevant. Recommend evaluation by a pulmonologist for chronic cough. She might benefit with flexible bronchoscopy to evaluate the tracheostomy site. Follow-up as needed.  Speech swallow study 02/21/22: Pt demonstrates normal orpharyngeal swallowing ability. There is no dysphagia present, no coughing during study. Esophageal sweep with barium tablet appeared WNL though no radiologist present to confirm. 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 Pt may benefit from f/u with SLP for both interventions targeting cough suppression and/or vocal hygiene and voice therapy.  EGD 06/09/22: - White nummular lesions in esophageal mucosa. Biopsied. - Acquired deformity (prior G-tube site) in the gastric body. - Duodenitis. Biopsied. - Biopsies were taken with a cold forceps for Helicobacter pylori testing. Path: 1. Surgical [P], duodenal - ACTIVE DUODENITIS CONSISTENT WITH PEPTIC INJURY. - NO INCREASED INTRAEPITHELIAL LYMPHOCYTES OR VILLOUS ATROPHY. 2. Surgical [P], gastric - ANTRAL AND OXYNTIC MUCOSA WITH NO SIGNIFICANT PATHOLOGIC CHANGES. - NO HELICOBACTER PYLORI IDENTIFIED. 3. Surgical [P], esophagus - SQUAMOUS MUCOSA WITH INCREASED EOSINOPHILS. - GREATER THAN 20 PER HIGH-POWER FIELD. - PAS STAIN FOR FUNGUS IS NEGATIVE.  EGD 09/13/22: - Esophageal mucosal changes suspicious for eosinophilic esophagitis. - Acquired deformity in the gastric body from prior G-tube. - Duodenitis. Biopsied. - Biopsies were taken with a cold forceps for evaluation of eosinophilic esophagitis. Path: 1. Surgical [P], distal esophagus - SQUAMOUS MUCOSA WITH PROMINENT BASAL CELL HYPERPLASIA AND INCREASED INTRAEPITHELIAL EOSINOPHILS (TOO NUMEROUS TO COUNT), SEE PART 2. 2. Surgical [P], proximal esophagus - SQUAMOUS MUCOSA WITH BASAL CELL HYPERPLASIA AND  SIGNIFICANTLY INCREASED INTRAEPITHELIAL EOSINOPHILS (TOO NUMEROUS TO COUNT)  CONSISTENT WITH EOSINOPHILIC ESOPHAGITIS IN THE APPROPRIATE CLINICAL/ENDOSCOPIC SETTING.  ASSESSMENT AND PLAN: Eosinophilic esophagitis Dysphagia GERD Patient presents for follow up of EoE. She was started on Dexilant therapy, which she has been taking consistently, but she has not found significant improvement in her dysphagia. Thus this suggests to me that she may not be fully responding to PPI therapy alone. I went over additional options for EoE treatment including food elimination diet, topical steroids, and Dupixent. Patient would be interested in Dupixent as a therapy. - Cont Dexilant - Start Dupixent 300 mg weekly - RTC 2-3 months  Eulah Pont, MD  I spent 36 minutes of time, including in depth chart review, independent review of results as outlined above, communicating results with the patient directly, face-to-face time with the patient, coordinating care, and ordering studies and medications as appropriate, and documentation.

## 2022-10-25 NOTE — Patient Instructions (Addendum)
If you are age 44 or younger, your body mass index should be between 19-25. Your Body mass index is 34.25 kg/m. If this is out of the aformentioned range listed, please consider follow up with your Primary Care Provider.  ________________________________________________________  The [facility] providers would like to encourage you to use [facility] to communicate with providers for non-urgent requests or questions.  Due to long hold times on the telephone, sending your provider a message by [facility] may be a faster and more efficient way to get a response.  Please allow 48 business hours for a response.  Please remember that this is for non-urgent requests.  _______________________________________________________  We have sent the following medications to your pharmacy for you to pick up at your convenience:  START: Dupixent 300mg /49ml inject once weekly.  You will need a follow up appointment in March 2024.  We will contact you to get this scheduled.   Thank you for entrusting me with your care and choosing [facility].  Dr [facility]

## 2022-11-18 ENCOUNTER — Other Ambulatory Visit: Payer: Self-pay

## 2022-11-18 MED ORDER — DEXLANSOPRAZOLE 60 MG PO CPDR: 60.0000 mg | DELAYED_RELEASE_CAPSULE | Freq: Every day | ORAL | 3 refills | Status: DC

## 2022-11-22 ENCOUNTER — Telehealth: Payer: Self-pay

## 2022-11-22 NOTE — Telephone Encounter (Signed)
Left message on voicemail to return call to our office to schedule 3 month follow up with Dr Lorenso Courier in March to evaluate eosinophilic esophagitis,dysphagia, GERD.  Will continue efforts.

## 2022-11-23 NOTE — Telephone Encounter (Signed)
Called patient to further discuss follow up appointment with Dr Lorenso Courier.  Patient stated she has not established with a specialty pharmacy to have [place] filled.  She would prefer that follow up appointment be scheduled further out in order for her to get started on [place].  Sent myself a staff message to follow up with her in April for an appointment.  Patient agreed to plan and verbalized understanding.  No further questions.

## 2022-12-26 NOTE — Telephone Encounter (Signed)
Per patient's chart office scanned in appeal approval-

## 2022-12-28 DIAGNOSIS — Z0000 Encounter for general adult medical examination without abnormal findings: Secondary | ICD-10-CM | POA: Diagnosis not present

## 2022-12-28 DIAGNOSIS — R7301 Impaired fasting glucose: Secondary | ICD-10-CM | POA: Diagnosis not present

## 2022-12-28 DIAGNOSIS — R7989 Other specified abnormal findings of blood chemistry: Secondary | ICD-10-CM | POA: Diagnosis not present

## 2023-01-04 DIAGNOSIS — M9901 Segmental and somatic dysfunction of cervical region: Secondary | ICD-10-CM | POA: Diagnosis not present

## 2023-01-04 DIAGNOSIS — R471 Dysarthria and anarthria: Secondary | ICD-10-CM | POA: Diagnosis not present

## 2023-01-04 DIAGNOSIS — J309 Allergic rhinitis, unspecified: Secondary | ICD-10-CM | POA: Diagnosis not present

## 2023-01-04 DIAGNOSIS — Z0000 Encounter for general adult medical examination without abnormal findings: Secondary | ICD-10-CM | POA: Diagnosis not present

## 2023-01-04 DIAGNOSIS — R7301 Impaired fasting glucose: Secondary | ICD-10-CM | POA: Diagnosis not present

## 2023-01-04 DIAGNOSIS — Z1331 Encounter for screening for depression: Secondary | ICD-10-CM | POA: Diagnosis not present

## 2023-01-04 DIAGNOSIS — J029 Acute pharyngitis, unspecified: Secondary | ICD-10-CM | POA: Diagnosis not present

## 2023-01-04 DIAGNOSIS — R82998 Other abnormal findings in urine: Secondary | ICD-10-CM | POA: Diagnosis not present

## 2023-01-04 DIAGNOSIS — G8193 Hemiplegia, unspecified affecting right nondominant side: Secondary | ICD-10-CM | POA: Diagnosis not present

## 2023-01-04 DIAGNOSIS — M9902 Segmental and somatic dysfunction of thoracic region: Secondary | ICD-10-CM | POA: Diagnosis not present

## 2023-01-04 DIAGNOSIS — Z1339 Encounter for screening examination for other mental health and behavioral disorders: Secondary | ICD-10-CM | POA: Diagnosis not present

## 2023-01-04 DIAGNOSIS — M7541 Impingement syndrome of right shoulder: Secondary | ICD-10-CM | POA: Diagnosis not present

## 2023-01-04 DIAGNOSIS — M9907 Segmental and somatic dysfunction of upper extremity: Secondary | ICD-10-CM | POA: Diagnosis not present

## 2023-01-05 DIAGNOSIS — M9901 Segmental and somatic dysfunction of cervical region: Secondary | ICD-10-CM | POA: Diagnosis not present

## 2023-01-05 DIAGNOSIS — M9907 Segmental and somatic dysfunction of upper extremity: Secondary | ICD-10-CM | POA: Diagnosis not present

## 2023-01-05 DIAGNOSIS — M7541 Impingement syndrome of right shoulder: Secondary | ICD-10-CM | POA: Diagnosis not present

## 2023-01-05 DIAGNOSIS — M9902 Segmental and somatic dysfunction of thoracic region: Secondary | ICD-10-CM | POA: Diagnosis not present

## 2023-01-10 DIAGNOSIS — M9907 Segmental and somatic dysfunction of upper extremity: Secondary | ICD-10-CM | POA: Diagnosis not present

## 2023-01-10 DIAGNOSIS — M9902 Segmental and somatic dysfunction of thoracic region: Secondary | ICD-10-CM | POA: Diagnosis not present

## 2023-01-10 DIAGNOSIS — M9901 Segmental and somatic dysfunction of cervical region: Secondary | ICD-10-CM | POA: Diagnosis not present

## 2023-01-10 DIAGNOSIS — M7541 Impingement syndrome of right shoulder: Secondary | ICD-10-CM | POA: Diagnosis not present

## 2023-01-12 DIAGNOSIS — M9907 Segmental and somatic dysfunction of upper extremity: Secondary | ICD-10-CM | POA: Diagnosis not present

## 2023-01-12 DIAGNOSIS — M9902 Segmental and somatic dysfunction of thoracic region: Secondary | ICD-10-CM | POA: Diagnosis not present

## 2023-01-12 DIAGNOSIS — M9901 Segmental and somatic dysfunction of cervical region: Secondary | ICD-10-CM | POA: Diagnosis not present

## 2023-01-12 DIAGNOSIS — M7541 Impingement syndrome of right shoulder: Secondary | ICD-10-CM | POA: Diagnosis not present

## 2023-01-18 ENCOUNTER — Encounter: Payer: Self-pay | Admitting: Speech Pathology

## 2023-01-18 ENCOUNTER — Other Ambulatory Visit: Payer: Self-pay

## 2023-01-18 ENCOUNTER — Ambulatory Visit: Payer: BC Managed Care – PPO | Attending: Internal Medicine | Admitting: Speech Pathology

## 2023-01-18 DIAGNOSIS — R498 Other voice and resonance disorders: Secondary | ICD-10-CM | POA: Insufficient documentation

## 2023-01-18 DIAGNOSIS — R471 Dysarthria and anarthria: Secondary | ICD-10-CM | POA: Diagnosis not present

## 2023-01-18 NOTE — Patient Instructions (Addendum)
  Semi-occluded vocal tract exercises (SOVTE)  These allow your vocal folds to vibrate without excess tension and promotes high placement of the voice  Use SOVTE as a warm up before prolonged speaking and vocal exercises   High resistance: voicing through a stirring straw  Medium resistance: voicing through a drinking straw  Less resistance: Voiced /v/                            Lip or Tongue Trill                            Nasal "hums" /m/ and /n/                            Vowels /u/ and ee   Gargle 10 breaths before each  Watch Vocal Straw Exercises with Lolita Cram on YouTube:  Hum 10 breaths  Pitch Glides for 2 minutes or 10 breaths  Accents (siren) for 2 minutes or 10 breaths  Hum the [employer]  Then do the same in water blowing bubbles with your hums, glides, accents and song  As always, use good belly breathing while completing SOVTE

## 2023-01-18 NOTE — Therapy (Deleted)
.  opr 

## 2023-01-18 NOTE — Therapy (Signed)
OUTPATIENT SPEECH LANGUAGE PATHOLOGY VOICE EVALUATION   Patient Name: [name] MRN: [id number] DOB:05/09/1976, 47 y.o., female Today's Date: 01/18/2023  PCP: Ginger Organ, MD REFERRING PROVIDER: Ginger Organ, MD  END OF SESSION:  End of Session - 01/18/23 1512     Visit Number 1    Number of Visits 25    Date for SLP Re-Evaluation 04/12/23    SLP Start Time 35    SLP Stop Time  1145    SLP Time Calculation (min) 45 min    Activity Tolerance Patient tolerated treatment well             Past Medical History:  Diagnosis Date   Allergy    Chronic headaches    Eosinophilic esophagitis    GERD (gastroesophageal reflux disease)    Metacarpal bone fracture    4th and 5th fingers   Seizure ([place])    TBI (traumatic brain injury) ([place]) 11/07/1997   has slight rt sided weakness   Past Surgical History:  Procedure Laterality Date   ACHILLES TENDON SURGERY Bilateral    BRAIN SURGERY     Stent placed and removed   CESAREAN SECTION     x3   ENDOMETRIAL ABLATION W/ NOVASURE  11/08/1999   OPEN REDUCTION INTERNAL FIXATION (ORIF) METACARPAL Right 03/14/2014   Procedure: OPEN REDUCTION INTERNAL FIXATION (ORIF) RIGHT FOURTH AND FIFTH METACARPAL;  Surgeon: Roseanne Kaufman, MD;  Location: [place];  Service: Orthopedics;  Laterality: Right;   STRABISMUS SURGERY     TOE SURGERY Right    TRACHEOSTOMY  11/07/1997   UPPER GASTROINTESTINAL ENDOSCOPY     Patient Active Problem List   Diagnosis Date Noted   Hoarseness 05/23/2022   Chronic cough 02/08/2022   Dysphagia 02/08/2022    Onset date: 01/04/2023 (referral)   REFERRING DIAG: R13.12 (ICD-10-CM) - Dysphagia, oropharyngeal phase  THERAPY DIAG:  Other voice and resonance disorders  Dysarthria and anarthria  Rationale for Evaluation and Treatment: Rehabilitation  SUBJECTIVE:   SUBJECTIVE STATEMENT: "I just want to be heard. I say something funny and no one can hear" Pt accompanied by:  self  PERTINENT HISTORY:  Anajah has had history of hoarseness, low volume and dyphagia with saliva only since TBI with [facility]  PAIN:  Are you having pain? No  FALLS: Has patient fallen in last 6 months? No,   LIVING ENVIRONMENT: Lives with: lives with their family Lives in: House/apartment  PLOF: Independent  PATIENT GOALS: "I want to be heard and understood"  OBJECTIVE:   DIAGNOSTIC FINDINGS:  MBSS April 2023: WNL oropharyngeal swallow  Laryngoscopy with strobe with Dr. Rowe Clack 05/2022 Amplitude - Left: normal Amplitude - Right: normal  Mucosal Wave - Left: normal Mucosal Wave - Right: normal  Vocal Fold Motion - Left: No motion abnormalities Vocal Fold Motion - Right: No motion abnormalities  Vertical Level of Approximation: equal Glottic Closure: complete  Phase Symmetry: symmetric Periodicity (Regularity): regular  Supraglottic Hyperfunction: mixed Nasopharynx: Bilateral tori wnl, no masses or lesions  Airway: Widely Patent Additional Observations: none  COGNITION: Overall cognitive status: History of cognitive impairments - at baseline mild high level memory since TBI however she is independent in all areas and IADL's Areas of impairment:  Memory: WFL for IADL's, managing her household and family   SOCIAL HISTORY: 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 intake: optimal Caffeine/alcohol intake: minimal Daily voice use: moderate  PERCEPTUAL VOICE ASSESSMENT: Voice quality: hoarse, rough, strained, low vocal intensity, and vocal fatigue Vocal abuse:  abnormal breathing pattern Resonance: hyponasal Respiratory function: clavicular breathing  OBJECTIVE VOICE ASSESSMENT: Maximum phonation time for sustained "ah": 10.91  Conversational pitch average: 207.7 Hz Conversational loudness average: 70 dB Conversational loudness range: 70 to 65 dB S/z ratio: n/a vocal folds WNL on laryngoscopy PATIENT REPORTED OUTCOME MEASURES (PROM): VHI: 95 - severe -[place]ffia rate 4, or "always" difficult:  being heard in noisy room, through the house, using phones less than I want, feeling left out because of my voice  TODAY'S TREATMENT:                                                                                                                                         DATE:   01/18/23: Initiated training in semi-occluded vocal tract exercises (SOVTE) and flow phonation. Letetia demonstrated SOVTE with rare min A. Flow phonation with usual mod A. She will complete SOVTE 2x a day - see pt instructions   PATIENT EDUCATION: Education details: see today's treatment, patient instructions, HEP for voice/dysarthria, compensations for voice/dysarthria Person educated: Patient Education method: Explanation, Demonstration, Verbal cues, and Handouts Education comprehension: returned demonstration, verbal cues required, and needs further education  HOME EXERCISE PROGRAM: SOVTE, flow phonation, resonant voice therapy  GOALS: Goals reviewed with patient? Yes  SHORT TERM GOALS: Target date: 02/15/23  Pt will complete HEP for voice/dysarthria with rare min A over 2 sessions Baseline: Goal status: INITIAL  2.  Pt will utilize flow phonation or resonant voice therapy to achieve clear phonation 18/20 phrases Baseline:  Goal status: INITIAL  3.  Pt will maintain volume and voice with out speaking on residual air over 18/20 10-12 word sentences Baseline:  Goal status: INITIAL  4.  Pt will self correct dysphonia/dysarthria with occasional min A 18/20 sentences Baseline:  Goal status: INITIAL   LONG TERM GOALS: Target date: 04/12/23  Pt will achieve clear, forward focus phonation over 10 minute conversation with rare min A Baseline:  Goal status: INITIAL  2.  Pt will complete HEP with mod I Baseline:  Goal status: INITIAL  3.  Pt will be 90% intelligible over 10 minute conversation in noisy environment Baseline:  Goal status: INITIAL  4.  Pt will improve score on VHI by 3 points Baseline:   Goal status: INITIAL   ASSESSMENT:  CLINICAL IMPRESSION: Patient is a 47 y.o. female who was seen today for mild dysarthria, mild to moderate dysphonia, mild dysphagia. She reports no dysphagia with PO, however difficulty swallowing her sailva. Slight slur with imprecise consonants. Kashauna's primary concern is the quality of her voice and her volume which affects her intelligibility. She was stimulable for improve phonation with semiocculded vocal tract exercises, resonant voice and flow phonation trials. Initially she felt her voice in her chest and throat. With resonant voice, she was able to move phonation forward and perceived this in her nose and lips. Sustained ah also elicited clear phonation. Natale Milch  does speak on residual air towards the ends of utterances resulting in glottal fry and loss of volume.Gray reports vague course of ST when she was in college but has not received speech therapy in the past 20+ years. Her speech and voice have resulted in her spouse being the primary communicator, Gennell avoiding phone calls, turns in fast moving conversations and inability to get quips and humor into conversations. I recommend skilled ST to maximize her intelligibility for safety, QOL and to reduce family burden.   OBJECTIVE IMPAIRMENTS: include dysarthria and voice disorder. These impairments are limiting patient from effectively communicating at home and in community and safety when swallowing. Factors affecting potential to achieve goals and functional outcome are previous level of function and time post onset .Marland Kitchen Patient will benefit from skilled SLP services to address above impairments and improve overall function.  REHAB POTENTIAL: Fair due to time post onset  PLAN:  SLP FREQUENCY: 2x/week  SLP DURATION: 12 weeks  PLANNED INTERVENTIONS: Aspiration precaution training, Diet toleration management , Environmental controls, Trials of upgraded texture/liquids, Internal/external aids, Functional  tasks, Multimodal communication approach, and SLP instruction and feedback    [place], Annye Rusk, [place] 01/18/2023, 3:25 PM

## 2023-01-19 DIAGNOSIS — M7541 Impingement syndrome of right shoulder: Secondary | ICD-10-CM | POA: Diagnosis not present

## 2023-01-19 DIAGNOSIS — M9901 Segmental and somatic dysfunction of cervical region: Secondary | ICD-10-CM | POA: Diagnosis not present

## 2023-01-19 DIAGNOSIS — M9907 Segmental and somatic dysfunction of upper extremity: Secondary | ICD-10-CM | POA: Diagnosis not present

## 2023-01-19 DIAGNOSIS — M9902 Segmental and somatic dysfunction of thoracic region: Secondary | ICD-10-CM | POA: Diagnosis not present

## 2023-01-23 ENCOUNTER — Ambulatory Visit: Payer: BC Managed Care – PPO | Admitting: Speech Pathology

## 2023-01-23 ENCOUNTER — Encounter: Payer: Self-pay | Admitting: Speech Pathology

## 2023-01-23 DIAGNOSIS — R498 Other voice and resonance disorders: Secondary | ICD-10-CM | POA: Diagnosis not present

## 2023-01-23 DIAGNOSIS — R471 Dysarthria and anarthria: Secondary | ICD-10-CM

## 2023-01-23 NOTE — Patient Instructions (Signed)
   We generate volume by taking a good breath - yes, you will have to think about it and give effort  Add to your HW 10 loud AH's with a good clear voice and volume  Add 10 pitch glides with good breath, clear voice and volume  Listen to your voice and fix it when you   Please look into a brain injury ID card to give to a cop or other person  We want a card that speaks to your speech and voice

## 2023-01-23 NOTE — Therapy (Signed)
OUTPATIENT SPEECH LANGUAGE PATHOLOGY TREATMENT NOTE   Patient Name: [name] MRN: [id number] DOB:1976-10-05, 47 y.o., female Today's Date: 01/23/2023  PCP: Ginger Organ., MD REFERRING PROVIDER: Ginger Organ., MD  END OF SESSION:   End of Session - 01/23/23 0854     Visit Number 2    Number of Visits 25    Date for SLP Re-Evaluation 04/12/23    SLP Start Time 0850    SLP Stop Time  0930    SLP Time Calculation (min) 40 min             Past Medical History:  Diagnosis Date   Allergy    Chronic headaches    Eosinophilic esophagitis    GERD (gastroesophageal reflux disease)    Metacarpal bone fracture    4th and 5th fingers   Seizure ([place])    TBI (traumatic brain injury) ([place]) 11/07/1997   has slight rt sided weakness   Past Surgical History:  Procedure Laterality Date   ACHILLES TENDON SURGERY Bilateral    BRAIN SURGERY     Stent placed and removed   CESAREAN SECTION     x3   ENDOMETRIAL ABLATION W/ NOVASURE  11/08/1999   OPEN REDUCTION INTERNAL FIXATION (ORIF) METACARPAL Right 03/14/2014   Procedure: OPEN REDUCTION INTERNAL FIXATION (ORIF) RIGHT FOURTH AND FIFTH METACARPAL;  Surgeon: Roseanne Kaufman, MD;  Location: [facility];  Service: Orthopedics;  Laterality: Right;   STRABISMUS SURGERY     TOE SURGERY Right    TRACHEOSTOMY  11/07/1997   UPPER GASTROINTESTINAL ENDOSCOPY     Patient Active Problem List   Diagnosis Date Noted   Hoarseness 05/23/2022   Chronic cough 02/08/2022   Dysphagia 02/08/2022    ONSET DATE: 01/04/2023  REFERRING DIAG: R13.12 (ICD-10-CM) - Dysphagia, oropharyngeal phase  THERAPY DIAG:  Other voice and resonance disorders  Dysarthria and anarthria  Rationale for Evaluation and Treatment: Rehabilitation  SUBJECTIVE:   SUBJECTIVE STATEMENT: "They refuse to serve me at bars because of my speech"  PAIN:  Are you having pain? No    OBJECTIVE:  MBSS WNL, stroboscopy - WNL  TODAY'S TREATMENT:                                                                                                                                          DATE:   01/23/23: [name] has consistently completed HEP of SOVTE. She enters with breathy, rough, strained voice. Loud ah 5x with clear, easy phonation and volume 80dB with rare min A. Modified this into Ah followed by short phrases, again with clear phonation achieved. Pitch glides again WNL with clear phonation and WNL dB. Instructed her to use adequate breath support and when she hears breathy, strained voice to take a breath and reset clear voice. In 10 word sentence reading, [name] required usual min verbal cues to ID voice change  from clear to strained/breathy. Ongoing cues for breath support. With cues, [name] able to correct harsh voice to more clear phonation with speaking with intent and volume. In structured task, [name] used intent to achieve clear phonation 70% of sentences when generating  3 sentence description of 10 basic objects. [name] expressed concern that speaking this way takes effort - education provided that she will have to make effort for a clearer loud voice initially. Introduced over articulation, which she is familiar with. Bravery feels un-natural and like she is "talking down" to a person when she uses this strategy. Will address this in future sessions, to try to practice so over articulation sounds more natural.   01/18/23: Initiated training in semi-occluded vocal tract exercises (SOVTE) and flow phonation. [name] demonstrated SOVTE with rare min A. Flow phonation with usual mod A. She will complete SOVTE 2x a day - see pt instructions     PATIENT EDUCATION: Education details: see today's treatment, patient instructions, HEP for voice/dysarthria, compensations for voice/dysarthria Person educated: Patient Education method: Explanation, Demonstration, Verbal cues, and Handouts Education comprehension: returned demonstration, verbal cues required, and  needs further education   HOME EXERCISE PROGRAM: SOVTE, flow phonation, resonant voice therapy   GOALS: Goals reviewed with patient? Yes   SHORT TERM GOALS: Target date: 02/15/23   Pt will complete HEP for voice/dysarthria with rare min A over 2 sessions Baseline: Goal status: ONGOING   2.  Pt will utilize flow phonation or resonant voice therapy to achieve clear phonation 18/20 phrases Baseline:  Goal status: ONGOING   3.  Pt will maintain volume and voice with out speaking on residual air over 18/20 10-12 word sentences Baseline:  Goal status: ONGOING   4.  Pt will self correct dysphonia/dysarthria with occasional min A 18/20 sentences Baseline:  Goal status: ONGOING     LONG TERM GOALS: Target date: 04/12/23   Pt will achieve clear, forward focus phonation over 10 minute conversation with rare min A Baseline:  Goal status: ONGOING   2.  Pt will complete HEP with mod I Baseline:  Goal status: ONGOING   3.  Pt will be 90% intelligible over 10 minute conversation in noisy environment Baseline:  Goal status: ONGOING   4.  Pt will improve score on VHI by 3 points Baseline:  Goal status: ONGOING     ASSESSMENT:   CLINICAL IMPRESSION: Patient is a 47 y.o. female who was seen today for mild dysarthria, mild to moderate dysphonia, mild dysphagia. She reports no dysphagia with PO, however difficulty swallowing her sailva. Slight slur with imprecise consonants. [name]'s primary concern is the quality of her voice and her volume which affects her intelligibility. She was stimulable for improve phonation with semiocculded vocal tract exercises, resonant voice and flow phonation trials. Initially she felt her voice in her chest and throat. With resonant voice, she was able to move phonation forward and perceived this in her nose and lips. Sustained ah also elicited clear phonation. [name] does speak on residual air towards the ends of utterances resulting in glottal fry and loss of  volume.[name] reports vague course of ST when she was in college but has not received speech therapy in the past 20+ years. Her speech and voice have resulted in her spouse being the primary communicator, [name] avoiding phone calls, turns in fast moving conversations and inability to get quips and humor into conversations. I recommend skilled ST to maximize her intelligibility for safety, QOL and to reduce family burden.  OBJECTIVE IMPAIRMENTS: include dysarthria and voice disorder. These impairments are limiting patient from effectively communicating at home and in community and safety when swallowing. Factors affecting potential to achieve goals and functional outcome are previous level of function and time post onset .Marland Kitchen Patient will benefit from skilled SLP services to address above impairments and improve overall function.   REHAB POTENTIAL: Fair due to time post onset   PLAN:   SLP FREQUENCY: 2x/week   SLP DURATION: 12 weeks   PLANNED INTERVENTIONS: Aspiration precaution training, Diet toleration management , Environmental controls, Trials of upgraded texture/liquids, Internal/external aids, Functional tasks, Multimodal communication approach, and SLP instruction and feedback          [place], Annye Rusk, [place] 01/23/2023, 10:29 AM

## 2023-01-25 ENCOUNTER — Ambulatory Visit: Payer: BC Managed Care – PPO | Admitting: Speech Pathology

## 2023-01-25 DIAGNOSIS — R471 Dysarthria and anarthria: Secondary | ICD-10-CM

## 2023-01-25 DIAGNOSIS — R498 Other voice and resonance disorders: Secondary | ICD-10-CM

## 2023-01-25 NOTE — Patient Instructions (Signed)
   Tongue out mmmmm  Tongue out pitch glides   Find the buzz in the front of your mouth,   Tongue out siren  Tongue out mmmm yeah  Mmm ok  Mmmm maybe  Mmmm sure  Mmm fine  Mmmm maybe tomorrow  Mmmmm my best friend  Mmmm no thanks  Mmmake more money  Mmmy mother makes me muffins  Mmmike has my math  Hammer and nails  How are you  How now brown cow  How is her horse?  Mmmm townely equestrian  Mmmm I'll have a chicken quesedilla  MMM[place] Mule  Mmm I'll have an IPA  Mmm Quiet Tater tot  Mmm come inside  Mmm dinner's ready  Mmmm stop that [place] some phrases you usually say to your dogs or kids with   When you hear your voice get breathy and hoarse - take a breath, use you mmm and speak wit intent

## 2023-01-25 NOTE — Therapy (Signed)
OUTPATIENT SPEECH LANGUAGE PATHOLOGY TREATMENT NOTE   Patient Name: [name] MRN: [id number] DOB:1976-02-02, 47 y.o., female Today's Date: 01/25/2023  PCP: Ginger Organ., MD REFERRING PROVIDER: Ginger Organ., MD  END OF SESSION:   End of Session - 01/25/23 0851     Visit Number 3    Number of Visits 25    Date for SLP Re-Evaluation 04/12/23    SLP Start Time 0845    SLP Stop Time  0930    SLP Time Calculation (min) 45 min    Activity Tolerance Patient tolerated treatment well             Past Medical History:  Diagnosis Date   Allergy    Chronic headaches    Eosinophilic esophagitis    GERD (gastroesophageal reflux disease)    Metacarpal bone fracture    4th and 5th fingers   Seizure ([place])    TBI (traumatic brain injury) ([place]) 11/07/1997   has slight rt sided weakness   Past Surgical History:  Procedure Laterality Date   ACHILLES TENDON SURGERY Bilateral    BRAIN SURGERY     Stent placed and removed   CESAREAN SECTION     x3   ENDOMETRIAL ABLATION W/ NOVASURE  11/08/1999   OPEN REDUCTION INTERNAL FIXATION (ORIF) METACARPAL Right 03/14/2014   Procedure: OPEN REDUCTION INTERNAL FIXATION (ORIF) RIGHT FOURTH AND FIFTH METACARPAL;  Surgeon: Roseanne Kaufman, MD;  Location: [facility];  Service: Orthopedics;  Laterality: Right;   STRABISMUS SURGERY     TOE SURGERY Right    TRACHEOSTOMY  11/07/1997   UPPER GASTROINTESTINAL ENDOSCOPY     Patient Active Problem List   Diagnosis Date Noted   Hoarseness 05/23/2022   Chronic cough 02/08/2022   Dysphagia 02/08/2022    ONSET DATE: 01/04/2023  REFERRING DIAG: R13.12 (ICD-10-CM) - Dysphagia, oropharyngeal phase  THERAPY DIAG:  Other voice and resonance disorders  Dysarthria and anarthria  Rationale for Evaluation and Treatment: Rehabilitation  SUBJECTIVE:   SUBJECTIVE STATEMENT: "They refuse to serve me at bars because of my speech"  PAIN:  Are you having pain?  No    OBJECTIVE:  MBSS WNL, stroboscopy - WNL  TODAY'S TREATMENT:                                                                                                                                         DATE:   01/25/23:  [name] reports some improvement in her voice. Introduced Research scientist (life sciences) therapy - tongue out hums, glides and phrases transitioned to tongue  /m/ before words (Ok, fine, sure etc) then to longer phrases and personally relevant phrases. Beginning phrases with nasal /m/ results in clear, strong phonation and [place]'s more ease in phonation with rare min A. In structured task, generating 1 sentence description, [name] carried over resonant voice 7/10x with usual verbal cues and modeling. In  conversation, max A for carryover and for adequate breath support when hoarse, breathy voice results from speaking on residual air. Added resonant voice exercises to HEP and generated list of personally relevant phrases /sentences to practice with resonant voice, beginning each with /m/. See patient instructions.   01/23/23: [name] has consistently completed HEP of SOVTE. She enters with breathy, rough, strained voice. Loud ah 5x with clear, easy phonation and volume 80dB with rare min A. Modified this into Ah followed by short phrases, again with clear phonation achieved. Pitch glides again WNL with clear phonation and WNL dB. Instructed her to use adequate breath support and when she hears breathy, strained voice to take a breath and reset clear voice. In 10 word sentence reading, [name] required usual min verbal cues to ID voice change from clear to strained/breathy. Ongoing cues for breath support. With cues, Leta able to correct harsh voice to more clear phonation with speaking with intent and volume. In structured task, [name] used intent to achieve clear phonation 70% of sentences when generating  3 sentence description of 10 basic objects. [name] expressed concern that speaking this way takes effort -  education provided that she will have to make effort for a clearer loud voice initially. Introduced over articulation, which she is familiar with. [name] feels un-natural and like she is "talking down" to a person when she uses this strategy. Will address this in future sessions, to try to practice so over articulation sounds more natural.   01/18/23: Initiated training in semi-occluded vocal tract exercises (SOVTE) and flow phonation. [name] demonstrated SOVTE with rare min A. Flow phonation with usual mod A. She will complete SOVTE 2x a day - see pt instructions     PATIENT EDUCATION: Education details: see today's treatment, patient instructions, HEP for voice/dysarthria, compensations for voice/dysarthria Person educated: Patient Education method: Explanation, Demonstration, Verbal cues, and Handouts Education comprehension: returned demonstration, verbal cues required, and needs further education   HOME EXERCISE PROGRAM: SOVTE, flow phonation, resonant voice therapy   GOALS: Goals reviewed with patient? Yes   SHORT TERM GOALS: Target date: 02/15/23   Pt will complete HEP for voice/dysarthria with rare min A over 2 sessions Baseline: Goal status: ONGOING   2.  Pt will utilize flow phonation or resonant voice therapy to achieve clear phonation 18/20 phrases Baseline:  Goal status: ONGOING   3.  Pt will maintain volume and voice with out speaking on residual air over 18/20 10-12 word sentences Baseline:  Goal status: ONGOING   4.  Pt will self correct dysphonia/dysarthria with occasional min A 18/20 sentences Baseline:  Goal status: ONGOING     LONG TERM GOALS: Target date: 04/12/23   Pt will achieve clear, forward focus phonation over 10 minute conversation with rare min A Baseline:  Goal status: ONGOING   2.  Pt will complete HEP with mod I Baseline:  Goal status: ONGOING   3.  Pt will be 90% intelligible over 10 minute conversation in noisy environment Baseline:  Goal  status: ONGOING   4.  Pt will improve score on VHI by 3 points Baseline:  Goal status: ONGOING     ASSESSMENT:   CLINICAL IMPRESSION: Patient is a 47 y.o. female who was seen today for mild dysarthria, mild to moderate dysphonia, mild dysphagia. She reports no dysphagia with PO, however difficulty swallowing her sailva. Slight slur with imprecise consonants. [name]'s primary concern is the quality of her voice and her volume which affects her intelligibility. She was stimulable  for improve phonation with semiocculded vocal tract exercises, resonant voice and flow phonation trials. Initially she felt her voice in her chest and throat. With resonant voice, she was able to move phonation forward and perceived this in her nose and lips. Sustained ah also elicited clear phonation. [name] does speak on residual air towards the ends of utterances resulting in glottal fry and loss of volume.[name] reports vague course of ST when she was in college but has not received speech therapy in the past 20+ years. Her speech and voice have resulted in her spouse being the primary communicator, [name] avoiding phone calls, turns in fast moving conversations and inability to get quips and humor into conversations. I recommend skilled ST to maximize her intelligibility for safety, QOL and to reduce family burden.    OBJECTIVE IMPAIRMENTS: include dysarthria and voice disorder. These impairments are limiting patient from effectively communicating at home and in community and safety when swallowing. Factors affecting potential to achieve goals and functional outcome are previous level of function and time post onset .Marland Kitchen Patient will benefit from skilled SLP services to address above impairments and improve overall function.   REHAB POTENTIAL: Fair due to time post onset   PLAN:   SLP FREQUENCY: 2x/week   SLP DURATION: 12 weeks   PLANNED INTERVENTIONS: Aspiration precaution training, Diet toleration management ,  Environmental controls, Trials of upgraded texture/liquids, Internal/external aids, Functional tasks, Multimodal communication approach, and SLP instruction and feedback          [place], Annye Rusk, [place] 01/25/2023, 10:43 AM

## 2023-01-26 DIAGNOSIS — M9901 Segmental and somatic dysfunction of cervical region: Secondary | ICD-10-CM | POA: Diagnosis not present

## 2023-01-26 DIAGNOSIS — M9902 Segmental and somatic dysfunction of thoracic region: Secondary | ICD-10-CM | POA: Diagnosis not present

## 2023-01-26 DIAGNOSIS — M7541 Impingement syndrome of right shoulder: Secondary | ICD-10-CM | POA: Diagnosis not present

## 2023-01-26 DIAGNOSIS — M9907 Segmental and somatic dysfunction of upper extremity: Secondary | ICD-10-CM | POA: Diagnosis not present

## 2023-01-26 DIAGNOSIS — S134XXA Sprain of ligaments of cervical spine, initial encounter: Secondary | ICD-10-CM | POA: Diagnosis not present

## 2023-01-29 DIAGNOSIS — S0181XA Laceration without foreign body of other part of head, initial encounter: Secondary | ICD-10-CM | POA: Diagnosis not present

## 2023-01-29 DIAGNOSIS — Z23 Encounter for immunization: Secondary | ICD-10-CM | POA: Diagnosis not present

## 2023-02-01 NOTE — Telephone Encounter (Signed)
Returned call to patient regarding follow up appointment to further discuss [place].  Patient stated she did not start [place] as instructed.  She does not plan to start  medication as she discussed with PCP.   Patient advised that she will need a follow up appointment with Dr Lorenso Courier.  Patient scheduled to follow up on 03-01-23 at 9:30am with Dr Lorenso Courier.  Patient agreed to plan and verbalized understanding.  No further questions.

## 2023-02-06 ENCOUNTER — Ambulatory Visit: Payer: BC Managed Care – PPO | Attending: Internal Medicine | Admitting: Speech Pathology

## 2023-02-06 DIAGNOSIS — R471 Dysarthria and anarthria: Secondary | ICD-10-CM | POA: Insufficient documentation

## 2023-02-06 DIAGNOSIS — R498 Other voice and resonance disorders: Secondary | ICD-10-CM | POA: Insufficient documentation

## 2023-02-06 NOTE — Therapy (Deleted)
OUTPATIENT SPEECH LANGUAGE PATHOLOGY TREATMENT NOTE   Patient Name: [name] MRN: [id number] DOB:07-Dec-1975, 47 y.o., female Today's Date: 01/25/2023  PCP: Ginger Organ., MD REFERRING PROVIDER: Ginger Organ., MD  END OF SESSION:   End of Session - 01/25/23 0851     Visit Number 3    Number of Visits 25    Date for SLP Re-Evaluation 04/12/23    SLP Start Time 0845    SLP Stop Time  0930    SLP Time Calculation (min) 45 min    Activity Tolerance Patient tolerated treatment well             Past Medical History:  Diagnosis Date   Allergy    Chronic headaches    Eosinophilic esophagitis    GERD (gastroesophageal reflux disease)    Metacarpal bone fracture    4th and 5th fingers   Seizure ([facility])    TBI (traumatic brain injury) ([facility]) 11/07/1997   has slight rt sided weakness   Past Surgical History:  Procedure Laterality Date   ACHILLES TENDON SURGERY Bilateral    BRAIN SURGERY     Stent placed and removed   CESAREAN SECTION     x3   ENDOMETRIAL ABLATION W/ NOVASURE  11/08/1999   OPEN REDUCTION INTERNAL FIXATION (ORIF) METACARPAL Right 03/14/2014   Procedure: OPEN REDUCTION INTERNAL FIXATION (ORIF) RIGHT FOURTH AND FIFTH METACARPAL;  Surgeon: Roseanne Kaufman, MD;  Location: [facility];  Service: Orthopedics;  Laterality: Right;   STRABISMUS SURGERY     TOE SURGERY Right    TRACHEOSTOMY  11/07/1997   UPPER GASTROINTESTINAL ENDOSCOPY     Patient Active Problem List   Diagnosis Date Noted   Hoarseness 05/23/2022   Chronic cough 02/08/2022   Dysphagia 02/08/2022    ONSET DATE: 01/04/2023  REFERRING DIAG: R13.12 (ICD-10-CM) - Dysphagia, oropharyngeal phase  THERAPY DIAG:  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 No    OBJECTIVE:  MBSS WNL, stroboscopy - WNL  TODAY'S  TREATMENT:                                                                                                                                         DATE:   02/06/23:  01/25/23:  [name] reports some improvement in her voice. Introduced Research scientist (life sciences) therapy - tongue out hums, glides and phrases transitioned to tongue  /m/ before words (Ok, fine, sure etc) then to longer phrases and personally relevant phrases. Beginning phrases with nasal /m/ results in clear, strong phonation and [name]'s more ease in phonation with rare min A. In structured task, generating 1 sentence description, [name] carried over resonant voice 7/10x with usual verbal cues and modeling. In conversation, max A for carryover and for adequate  breath support when hoarse, breathy voice results from speaking on residual air. Added resonant voice exercises to HEP and generated list of personally relevant phrases /sentences to practice with resonant voice, beginning each with /m/. See patient instructions.   01/23/23: [name] has consistently completed HEP of SOVTE. She enters with breathy, rough, strained voice. Loud ah 5x with clear, easy phonation and volume 80dB with rare min A. Modified this into Ah followed by short phrases, again with clear phonation achieved. Pitch glides again WNL with clear phonation and WNL dB. Instructed her to use adequate breath support and when she hears breathy, strained voice to take a breath and reset clear voice. In 10 word sentence reading, [name] required usual min verbal cues to Rocha voice change from clear to strained/breathy. Ongoing cues for breath support. With cues, [name] able to correct harsh voice to more clear phonation with speaking with intent and volume. In structured task, [name] used intent to achieve clear phonation 70% of sentences when generating  3 sentence description of 10 basic objects. [name] expressed concern that speaking this way takes effort - education provided that she will have to make effort for  a clearer loud voice initially. Introduced over articulation, which she is familiar with. [name] feels un-natural and like she is "talking down" to a person when she uses this strategy. Will address this in future sessions, to try to practice so over articulation sounds more natural.   01/18/23: Initiated training in semi-occluded vocal tract exercises (SOVTE) and flow phonation. [name] demonstrated SOVTE with rare min A. Flow phonation with usual mod A. She will complete SOVTE 2x a day - see pt instructions     PATIENT EDUCATION: Education details: see today's treatment, patient instructions, HEP for voice/dysarthria, compensations for voice/dysarthria Person educated: Patient Education method: Explanation, Demonstration, Verbal cues, and Handouts Education comprehension: returned demonstration, verbal cues required, and needs further education   HOME EXERCISE PROGRAM: SOVTE, flow phonation, resonant voice therapy   GOALS: Goals reviewed with patient? Yes   SHORT TERM GOALS: Target date: 02/15/23   Pt will complete HEP for voice/dysarthria with rare min A over 2 sessions Baseline: Goal status: ONGOING   2.  Pt will utilize flow phonation or resonant voice therapy to achieve clear phonation 18/20 phrases Baseline:  Goal status: ONGOING   3.  Pt will maintain volume and voice with out speaking on residual air over 18/20 10-12 word sentences Baseline:  Goal status: ONGOING   4.  Pt will self correct dysphonia/dysarthria with occasional min A 18/20 sentences Baseline:  Goal status: ONGOING     LONG TERM GOALS: Target date: 04/12/23   Pt will achieve clear, forward focus phonation over 10 minute conversation with rare min A Baseline:  Goal status: ONGOING   2.  Pt will complete HEP with mod I Baseline:  Goal status: ONGOING   3.  Pt will be 90% intelligible over 10 minute conversation in noisy environment Baseline:  Goal status: ONGOING   4.  Pt will improve score on VHI by 3  points Baseline:  Goal status: ONGOING     ASSESSMENT:   CLINICAL IMPRESSION: Patient is a 47 y.o. female who was seen today for mild dysarthria, mild to moderate dysphonia, mild dysphagia. She reports no dysphagia with PO, however difficulty swallowing her sailva. Slight slur with imprecise consonants. [name]'s primary concern is the quality of her voice and her volume which affects her intelligibility. She was stimulable for improve phonation with semiocculded vocal tract exercises,  resonant voice and flow phonation trials. Initially she felt her voice in her chest and throat. With resonant voice, she was able to move phonation forward and perceived this in her nose and lips. Sustained ah also elicited clear phonation. [name] does speak on residual air towards the ends of utterances resulting in glottal fry and loss of volume.[name] reports vague course of ST when she was in college but has not received speech therapy in the past 20+ years. Her speech and voice have resulted in her spouse being the primary communicator, [name] avoiding phone calls, turns in fast moving conversations and inability to get quips and humor into conversations. I recommend skilled ST to maximize her intelligibility for safety, QOL and to reduce family burden.    OBJECTIVE IMPAIRMENTS: include dysarthria and voice disorder. These impairments are limiting patient from effectively communicating at home and in community and safety when swallowing. Factors affecting potential to achieve goals and functional outcome are previous level of function and time post onset .Marland Kitchen Patient will benefit from skilled SLP services to address above impairments and improve overall function.   REHAB POTENTIAL: Fair due to time post onset   PLAN:   SLP FREQUENCY: 2x/week   SLP DURATION: 12 weeks   PLANNED INTERVENTIONS: Aspiration precaution training, Diet toleration management , Environmental controls, Trials of upgraded texture/liquids,  Internal/external aids, Functional tasks, Multimodal communication approach, and SLP instruction and feedback          [place], Annye Rusk, [place] 01/25/2023, 10:43 AM

## 2023-02-08 ENCOUNTER — Ambulatory Visit: Payer: BC Managed Care – PPO | Admitting: Speech Pathology

## 2023-02-08 DIAGNOSIS — R471 Dysarthria and anarthria: Secondary | ICD-10-CM | POA: Diagnosis not present

## 2023-02-08 DIAGNOSIS — R498 Other voice and resonance disorders: Secondary | ICD-10-CM | POA: Diagnosis not present

## 2023-02-08 NOTE — Therapy (Signed)
OUTPATIENT SPEECH LANGUAGE PATHOLOGY TREATMENT NOTE   Patient Name: [name] MRN: [id number] DOB:June 27, 1976, 47 y.o., female Today's Date: 02/08/2023  PCP: Ginger Organ., MD REFERRING PROVIDER: Ginger Organ., MD  END OF SESSION:   End of Session - 02/08/23 0939     Visit Number 4    Number of Visits 25    Date for SLP Re-Evaluation 04/12/23    SLP Start Time 0937    SLP Stop Time  1015    SLP Time Calculation (min) 38 min    Activity Tolerance Patient tolerated treatment well              Past Medical History:  Diagnosis Date   Allergy    Chronic headaches    Eosinophilic esophagitis    GERD (gastroesophageal reflux disease)    Metacarpal bone fracture    4th and 5th fingers   Seizure ([facility])    TBI (traumatic brain injury) ([facility]) 11/07/1997   has slight rt sided weakness   Past Surgical History:  Procedure Laterality Date   ACHILLES TENDON SURGERY Bilateral    BRAIN SURGERY     Stent placed and removed   CESAREAN SECTION     x3   ENDOMETRIAL ABLATION W/ NOVASURE  11/08/1999   OPEN REDUCTION INTERNAL FIXATION (ORIF) METACARPAL Right 03/14/2014   Procedure: OPEN REDUCTION INTERNAL FIXATION (ORIF) RIGHT FOURTH AND FIFTH METACARPAL;  Surgeon: Roseanne Kaufman, MD;  Location: [facility];  Service: Orthopedics;  Laterality: Right;   STRABISMUS SURGERY     TOE SURGERY Right    TRACHEOSTOMY  11/07/1997   UPPER GASTROINTESTINAL ENDOSCOPY     Patient Active Problem List   Diagnosis Date Noted   Hoarseness 05/23/2022   Chronic cough 02/08/2022   Dysphagia 02/08/2022    ONSET DATE: 01/04/2023  REFERRING DIAG: R13.12 (ICD-10-CM) - Dysphagia, oropharyngeal phase  THERAPY DIAG:  Other voice and resonance disorders  Dysarthria and anarthria  Rationale for Evaluation and Treatment: Rehabilitation  SUBJECTIVE:   SUBJECTIVE STATEMENT: "My vacation was good!"  PAIN:  Are you having pain? No  OBJECTIVE:  MBSS WNL, stroboscopy -  WNL  TODAY'S TREATMENT:                                                                                                                                         DATE:   02/08/23: Pt entered with breathy, rough, strained voice. Continued educating pt on purpose and benefit of resonant voice therapy. Given consistent model from SLP, pt completed resonant voice exercises. With occasional min-A, pt accurately performed exercises, demonstrating improvement in vocal quality and reduction in rough and strained voice. Transitioned from word level to longer utterances (phrase, sentences). At discourse level, pt required more frequent verbal cues, eventually faded to occasional min-A.  HEP: Continue resonant voice exercises at home  01/25/23:  [name] reports some improvement in her  voice. Introduced resonant voice therapy - tongue out hums, glides and phrases transitioned to tongue  /m/ before words (Ok, fine, sure etc) then to longer phrases and personally relevant phrases. Beginning phrases with nasal /m/ results in clear, strong phonation and Muntaz Id's more ease in phonation with rare min A. In structured task, generating 1 sentence description, [name] carried over resonant voice 7/10x with usual verbal cues and modeling. In conversation, max A for carryover and for adequate breath support when hoarse, breathy voice results from speaking on residual air. Added resonant voice exercises to HEP and generated list of personally relevant phrases /sentences to practice with resonant voice, beginning each with /m/. See patient instructions.   01/23/23: [name] has consistently completed HEP of SOVTE. She enters with breathy, rough, strained voice. Loud ah 5x with clear, easy phonation and volume 80dB with rare min A. Modified this into Ah followed by short phrases, again with clear phonation achieved. Pitch glides again WNL with clear phonation and WNL dB. Instructed her to use adequate breath support and when she hears breathy,  strained voice to take a breath and reset clear voice. In 10 word sentence reading, [name] required usual min verbal cues to ID voice change from clear to strained/breathy. Ongoing cues for breath support. With cues, [name] able to correct harsh voice to more clear phonation with speaking with intent and volume. In structured task, [name] used intent to achieve clear phonation 70% of sentences when generating  3 sentence description of 10 basic objects. [name] expressed concern that speaking this way takes effort - education provided that she will have to make effort for a clearer loud voice initially. Introduced over articulation, which she is familiar with. Safina feels un-natural and like she is "talking down" to a person when she uses this strategy. Will address this in future sessions, to try to practice so over articulation sounds more natural.   01/18/23: Initiated training in semi-occluded vocal tract exercises (SOVTE) and flow phonation. [name] demonstrated SOVTE with rare min A. Flow phonation with usual mod A. She will complete SOVTE 2x a day - see pt instructions     PATIENT EDUCATION: Education details: see today's treatment, patient instructions, HEP for voice/dysarthria, compensations for voice/dysarthria Person educated: Patient Education method: Explanation, Demonstration, Verbal cues, and Handouts Education comprehension: returned demonstration, verbal cues required, and needs further education   HOME EXERCISE PROGRAM: SOVTE, flow phonation, resonant voice therapy   GOALS: Goals reviewed with patient? Yes   SHORT TERM GOALS: Target date: 02/15/23   Pt will complete HEP for voice/dysarthria with rare min A over 2 sessions Baseline: Goal status: ONGOING   2.  Pt will utilize flow phonation or resonant voice therapy to achieve clear phonation 18/20 phrases Baseline:  Goal status: ONGOING   3.  Pt will maintain volume and voice with out speaking on residual air over 18/20 10-12 word  sentences Baseline:  Goal status: ONGOING   4.  Pt will self correct dysphonia/dysarthria with occasional min A 18/20 sentences Baseline:  Goal status: ONGOING     LONG TERM GOALS: Target date: 04/12/23   Pt will achieve clear, forward focus phonation over 10 minute conversation with rare min A Baseline:  Goal status: ONGOING   2.  Pt will complete HEP with mod I Baseline:  Goal status: ONGOING   3.  Pt will be 90% intelligible over 10 minute conversation in noisy environment Baseline:  Goal status: ONGOING   4.  Pt will improve score on  VHI by 3 points Baseline:  Goal status: ONGOING     ASSESSMENT:   CLINICAL IMPRESSION: Patient is a 47 y.o. female who was seen today for voice therapy. Continued instructing pt in resonant voice therapy exercises. Worked up from word level to discourse level. Pt completing HEP and demonstrating improved vocal quality in sessions. I continue to recommend skilled ST to maximize her intelligibility for safety, QOL and to reduce family burden.    OBJECTIVE IMPAIRMENTS: include dysarthria and voice disorder. These impairments are limiting patient from effectively communicating at home and in community and safety when swallowing. Factors affecting potential to achieve goals and functional outcome are previous level of function and time post onset .Marland Kitchen Patient will benefit from skilled SLP services to address above impairments and improve overall function.   REHAB POTENTIAL: Fair due to time post onset   PLAN:   SLP FREQUENCY: 2x/week   SLP DURATION: 12 weeks   PLANNED INTERVENTIONS: Aspiration precaution training, Diet toleration management , Environmental controls, Trials of upgraded texture/liquids, Internal/external aids, Functional tasks, Multimodal communication approach, and SLP instruction and feedback          Leroy Libman, [place] 02/08/2023, 9:40 AM

## 2023-02-13 ENCOUNTER — Ambulatory Visit: Payer: BC Managed Care – PPO | Admitting: Speech Pathology

## 2023-02-13 DIAGNOSIS — R471 Dysarthria and anarthria: Secondary | ICD-10-CM

## 2023-02-13 DIAGNOSIS — R498 Other voice and resonance disorders: Secondary | ICD-10-CM | POA: Diagnosis not present

## 2023-02-13 NOTE — Therapy (Signed)
OUTPATIENT SPEECH LANGUAGE PATHOLOGY TREATMENT NOTE   Patient Name: [name] MRN: [id number] DOB:09/24/1976, 47 y.o., female Today's Date: 02/13/2023  PCP: Cleatis PolkaShaw, William D Jr., MD REFERRING PROVIDER: Cleatis PolkaShaw, William D Jr., MD  END OF SESSION:   End of Session - 02/13/23 1108     Visit Number 5    Number of Visits 25    Date for SLP Re-Evaluation 04/12/23    SLP Start Time 1103    SLP Stop Time  1145    SLP Time Calculation (min) 42 min    Activity Tolerance Patient tolerated treatment well            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   has slight rt sided weakness   Past Surgical History:  Procedure Laterality Date   ACHILLES TENDON SURGERY Bilateral    BRAIN SURGERY     Stent placed and removed   CESAREAN SECTION     x3   ENDOMETRIAL ABLATION W/ NOVASURE  11/08/1999   OPEN REDUCTION INTERNAL FIXATION (ORIF) METACARPAL Right 03/14/2014   Procedure: OPEN REDUCTION INTERNAL FIXATION (ORIF) RIGHT FOURTH AND FIFTH METACARPAL;  Surgeon: Dominica SeverinWilliam Gramig, MD;  Location: [facility];  Service: Orthopedics;  Laterality: Right;   STRABISMUS SURGERY     TOE SURGERY Right    TRACHEOSTOMY  11/07/1997   UPPER GASTROINTESTINAL ENDOSCOPY     Patient Active Problem List   Diagnosis Date Noted   Hoarseness 05/23/2022   Chronic cough 02/08/2022   Dysphagia 02/08/2022    ONSET DATE: 01/04/2023  REFERRING DIAG: R13.12 (ICD-10-CM) - Dysphagia, oropharyngeal phase  THERAPY DIAG:  Dysarthria and anarthria  Rationale for Evaluation and Treatment: Rehabilitation  SUBJECTIVE:   SUBJECTIVE STATEMENT: "I need to drive to [place] because I left my keys there"  PAIN:  Are you having pain? No  OBJECTIVE:  MBSS WNL, stroboscopy -  WNL  TODAY'S TREATMENT:                                                                                                                                         DATE:   02/13/23: Pt with clear voice in waiting room, but worsened vocal quality (breathy, rough, strained) after entering therapy room. Given frequent model from SLP, pt completed resonant voice exercises as warm ups. Pt required frequent model and min-A to correct poor vocal quality when reading sentences, demonstrated clear vocal quality in 65% of opportunities. In discourse, pt again required frequent min-A to maintain clear voice.  When pt's voice reduced in quality, pt reached 67 dB volume, although reached 70 dB in conversation when she used clear vocal  quality.   02/08/23: Pt entered with breathy, rough, strained voice. Continued educating pt on purpose and benefit of resonant voice therapy. Given consistent model from SLP, pt completed resonant voice exercises. With occasional min-A, pt accurately performed exercises, demonstrating improvement in vocal quality and reduction in rough and strained voice. Transitioned from word level to longer utterances (phrase, sentences). At discourse level, pt required more frequent verbal cues, eventually faded to occasional min-A.  HEP: Continue resonant voice exercises at home  01/25/23:  [name] reports some improvement in her voice. Introduced Tourist information centre manager therapy - tongue out hums, glides and phrases transitioned to tongue  /m/ before words (Ok, fine, sure etc) then to longer phrases and personally relevant phrases. Beginning phrases with nasal /m/ results in clear, strong phonation and [name] more ease in phonation with rare min A. In structured task, generating 1 sentence description, [name] carried over resonant voice 7/10x with usual verbal cues and modeling. In conversation, max A for carryover and for adequate breath support when hoarse, breathy voice results from speaking on residual air. Added  resonant voice exercises to HEP and generated list of personally relevant phrases /sentences to practice with resonant voice, beginning each with /m/. See patient instructions.   01/23/23: [name] has consistently completed HEP of SOVTE. She enters with breathy, rough, strained voice. Loud ah 5x with clear, easy phonation and volume 80dB with rare min A. Modified this into Ah followed by short phrases, again with clear phonation achieved. Pitch glides again WNL with clear phonation and WNL dB. Instructed her to use adequate breath support and when she hears breathy, strained voice to take a breath and reset clear voice. In 10 word sentence reading, [name] required usual min verbal cues to ID voice change from clear to strained/breathy. Ongoing cues for breath support. With cues, [name] able to correct harsh voice to more clear phonation with speaking with intent and volume. In structured task, [name] used intent to achieve clear phonation 70% of sentences when generating  3 sentence description of 10 basic objects. [name] expressed concern that speaking this way takes effort - education provided that she will have to make effort for a clearer loud voice initially. Introduced over articulation, which she is familiar with. [name] feels un-natural and like she is "talking down" to a person when she uses this strategy. Will address this in future sessions, to try to practice so over articulation sounds more natural.   01/18/23: Initiated training in semi-occluded vocal tract exercises (SOVTE) and flow phonation. Jaleen demonstrated SOVTE with rare min A. Flow phonation with usual mod A. She will complete SOVTE 2x a day - see pt instructions     PATIENT EDUCATION: Education details: see today's treatment, patient instructions, HEP for voice/dysarthria, compensations for voice/dysarthria Person educated: Patient Education method: Explanation, Demonstration, Verbal cues, and Handouts Education comprehension: returned  demonstration, verbal cues required, and needs further education   HOME EXERCISE PROGRAM: SOVTE, flow phonation, resonant voice therapy   GOALS: Goals reviewed with patient? Yes   SHORT TERM GOALS: Target date: 02/15/23   Pt will complete HEP for voice/dysarthria with rare min A over 2 sessions Baseline: Goal status: ONGOING   2.  Pt will utilize flow phonation or resonant voice therapy to achieve clear phonation 18/20 phrases Baseline:  Goal status: ONGOING   3.  Pt will maintain volume and voice with out speaking on residual air over 18/20 10-12 word sentences Baseline:  Goal status: ONGOING   4.  Pt will self  correct dysphonia/dysarthria with occasional min A 18/20 sentences Baseline:  Goal status: ONGOING     LONG TERM GOALS: Target date: 04/12/23   Pt will achieve clear, forward focus phonation over 10 minute conversation with rare min A Baseline:  Goal status: ONGOING   2.  Pt will complete HEP with mod I Baseline:  Goal status: ONGOING   3.  Pt will be 90% intelligible over 10 minute conversation in noisy environment Baseline:  Goal status: ONGOING   4.  Pt will improve score on VHI by 3 points Baseline:  Goal status: ONGOING     ASSESSMENT:   CLINICAL IMPRESSION: Patient is a 47 y.o. female who was seen today for voice therapy. Continued instructing pt in resonant voice therapy exercises. Worked up from word level to discourse level. Pt completing HEP and demonstrating improved vocal quality in sessions. I continue to recommend skilled ST to maximize her intelligibility for safety, QOL and to reduce family burden.    OBJECTIVE IMPAIRMENTS: include dysarthria and voice disorder. These impairments are limiting patient from effectively communicating at home and in community and safety when swallowing. Factors affecting potential to achieve goals and functional outcome are previous level of function and time post onset .Marland Kitchen Patient will benefit from skilled SLP  services to address above impairments and improve overall function.   REHAB POTENTIAL: Fair due to time post onset   PLAN:   SLP FREQUENCY: 2x/week   SLP DURATION: 12 weeks   PLANNED INTERVENTIONS: Aspiration precaution training, Diet toleration management , Environmental controls, Trials of upgraded texture/liquids, Internal/external aids, Functional tasks, Multimodal communication approach, and SLP instruction and feedback          [place], Radene Journey, CCC-SLP 02/13/2023, 3:40 PM

## 2023-02-15 ENCOUNTER — Ambulatory Visit: Payer: BC Managed Care – PPO | Admitting: Speech Pathology

## 2023-02-15 ENCOUNTER — Encounter: Payer: Self-pay | Admitting: Speech Pathology

## 2023-02-15 DIAGNOSIS — R498 Other voice and resonance disorders: Secondary | ICD-10-CM

## 2023-02-15 DIAGNOSIS — R471 Dysarthria and anarthria: Secondary | ICD-10-CM | POA: Diagnosis not present

## 2023-02-15 NOTE — Therapy (Signed)
OUTPATIENT SPEECH LANGUAGE PATHOLOGY TREATMENT NOTE   Patient Name: [name] MRN: [id number] DOB:1975/12/10, 47 y.o., female Today's Date: 02/15/2023  PCP: Cleatis Polka., MD REFERRING PROVIDER: Cleatis Polka., MD  END OF SESSION:   End of Session - 02/15/23 0856     Visit Number 6    Number of Visits 25    Date for SLP Re-Evaluation 04/12/23    SLP Start Time 0845    SLP Stop Time  0930    SLP Time Calculation (min) 45 min    Activity Tolerance Patient tolerated treatment well            Past Medical History:  Diagnosis Date   Allergy    Chronic headaches    Eosinophilic esophagitis    GERD (gastroesophageal reflux disease)    Metacarpal bone fracture    4th and 5th fingers   Seizure    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   has slight rt sided weakness   Past Surgical History:  Procedure Laterality Date   ACHILLES TENDON SURGERY Bilateral    BRAIN SURGERY     Stent placed and removed   CESAREAN SECTION     x3   ENDOMETRIAL ABLATION W/ NOVASURE  11/08/1999   OPEN REDUCTION INTERNAL FIXATION (ORIF) METACARPAL Right 03/14/2014   Procedure: OPEN REDUCTION INTERNAL FIXATION (ORIF) RIGHT FOURTH AND FIFTH METACARPAL;  Surgeon: Dominica Severin, MD;  Location: [facility];  Service: Orthopedics;  Laterality: Right;   STRABISMUS SURGERY     TOE SURGERY Right    TRACHEOSTOMY  11/07/1997   UPPER GASTROINTESTINAL ENDOSCOPY     Patient Active Problem List   Diagnosis Date Noted   Hoarseness 05/23/2022   Chronic cough 02/08/2022   Dysphagia 02/08/2022    ONSET DATE: 01/04/2023  REFERRING DIAG: R13.12 (ICD-10-CM) - Dysphagia, oropharyngeal phase  THERAPY DIAG:  Dysarthria and anarthria  Other voice and resonance disorders  Rationale for Evaluation and Treatment: Rehabilitation  SUBJECTIVE:   SUBJECTIVE STATEMENT: "I'm trying"  PAIN:  Are you having pain? No  OBJECTIVE:  MBSS WNL, stroboscopy - WNL  TODAY'S TREATMENT:                                                                                                                                          DATE:    02/15/23: [name] enters with hoarse, strained voice. Semi occluded vocal tract exercises with straw and resonant voice exercises with tongue in and out - she achieves clear, easy phonation with "m" before phrases. In structured task generating 3 sentence descriptions using resonant voice she achieved clear phonation 15/20 sentences with occasional min A - verbal cues and modeling. In conversation [name] required frequent mod A to Rocha and self correct talking on residual air with strain and hoarseness. She is quite talkative and falls back into strained voice and talking on residual air  frequently  02/13/23: Pt with clear voice in waiting room, but worsened vocal quality (breathy, rough, strained) after entering therapy room. Given frequent model from SLP, pt completed resonant voice exercises as warm ups. Pt required frequent model and min-A to correct poor vocal quality when reading sentences, demonstrated clear vocal quality in 65% of opportunities. In discourse, pt again required frequent min-A to maintain clear voice.  When pt's voice reduced in quality, pt reached 67 dB volume, although reached 70 dB in conversation when she used clear vocal quality.   02/08/23: Pt entered with breathy, rough, strained voice. Continued educating pt on purpose and benefit of resonant voice therapy. Given consistent model from SLP, pt completed resonant voice exercises. With occasional min-A, pt accurately performed exercises, demonstrating improvement in vocal quality and reduction in rough and strained voice. Transitioned from word level to longer utterances (phrase, sentences). At discourse level, pt required more frequent verbal cues, eventually faded to occasional min-A.  HEP: Continue resonant voice exercises at home  01/25/23:  [name] reports some improvement in her voice. Introduced  Tourist information centre manager therapy - tongue out hums, glides and phrases transitioned to tongue  /m/ before words (Ok, fine, sure etc) then to longer phrases and personally relevant phrases. Beginning phrases with nasal /m/ results in clear, strong phonation and [name]'s more ease in phonation with rare min A. In structured task, generating 1 sentence description, [name] carried over resonant voice 7/10x with usual verbal cues and modeling. In conversation, max A for carryover and for adequate breath support when hoarse, breathy voice results from speaking on residual air. Added resonant voice exercises to HEP and generated list of personally relevant phrases /sentences to practice with resonant voice, beginning each with /m/. See patient instructions.   01/23/23: [name] has consistently completed HEP of SOVTE. She enters with breathy, rough, strained voice. Loud ah 5x with clear, easy phonation and volume 80dB with rare min A. Modified this into Ah followed by short phrases, again with clear phonation achieved. Pitch glides again WNL with clear phonation and WNL dB. Instructed her to use adequate breath support and when she hears breathy, strained voice to take a breath and reset clear voice. In 10 word sentence reading, [name] required usual min verbal cues to Rocha voice change from clear to strained/breathy. Ongoing cues for breath support. With cues, [name] able to correct harsh voice to more clear phonation with speaking with intent and volume. In structured task, [name] used intent to achieve clear phonation 70% of sentences when generating  3 sentence description of 10 basic objects. [name] expressed concern that speaking this way takes effort - education provided that she will have to make effort for a clearer loud voice initially. Introduced over articulation, which she is familiar with. [name] feels un-natural and like she is "talking down" to a person when she uses this strategy. Will address this in future sessions, to try to  practice so over articulation sounds more natural.   01/18/23: Initiated training in semi-occluded vocal tract exercises (SOVTE) and flow phonation. [name] demonstrated SOVTE with rare min A. Flow phonation with usual mod A. She will complete SOVTE 2x a day - see pt instructions     PATIENT EDUCATION: Education details: see today's treatment, patient instructions, HEP for voice/dysarthria, compensations for voice/dysarthria Person educated: Patient Education method: Explanation, Demonstration, Verbal cues, and Handouts Education comprehension: returned demonstration, verbal cues required, and needs further education   HOME EXERCISE PROGRAM: SOVTE, flow phonation, resonant voice therapy  GOALS: Goals reviewed with patient? Yes   SHORT TERM GOALS: Target date: 02/15/23   Pt will complete HEP for voice/dysarthria with rare min A over 2 sessions Baseline: Goal status: ONGOING   2.  Pt will utilize flow phonation or resonant voice therapy to achieve clear phonation 18/20 phrases Baseline:  Goal status: ONGOING   3.  Pt will maintain volume and voice with out speaking on residual air over 18/20 10-12 word sentences Baseline:  Goal status: ONGOING   4.  Pt will self correct dysphonia/dysarthria with occasional min A 18/20 sentences Baseline:  Goal status: ONGOING     LONG TERM GOALS: Target date: 04/12/23   Pt will achieve clear, forward focus phonation over 10 minute conversation with rare min A Baseline:  Goal status: ONGOING   2.  Pt will complete HEP with mod I Baseline:  Goal status: ONGOING   3.  Pt will be 90% intelligible over 10 minute conversation in noisy environment Baseline:  Goal status: ONGOING   4.  Pt will improve score on VHI by 3 points Baseline:  Goal status: ONGOING     ASSESSMENT:   CLINICAL IMPRESSION: Patient is a 47 y.o. female who was seen today for voice therapy. Continued instructing pt in resonant voice therapy exercises. Worked up from word  level to discourse level. Pt completing HEP and demonstrating improved vocal quality in sessions. I continue to recommend skilled ST to maximize her intelligibility for safety, QOL and to reduce family burden.    OBJECTIVE IMPAIRMENTS: include dysarthria and voice disorder. These impairments are limiting patient from effectively communicating at home and in community and safety when swallowing. Factors affecting potential to achieve goals and functional outcome are previous level of function and time post onset .Marland Kitchen. Patient will benefit from skilled SLP services to address above impairments and improve overall function.   REHAB POTENTIAL: Fair due to time post onset   PLAN:   SLP FREQUENCY: 2x/week   SLP DURATION: 12 weeks   PLANNED INTERVENTIONS: Aspiration precaution training, Diet toleration management , Environmental controls, Trials of upgraded texture/liquids, Internal/external aids, Functional tasks, Multimodal communication approach, and SLP instruction and feedback          Kenniel Bergsma, Radene JourneyLaura Ann, CCC-SLP 02/15/2023, 10:22 AM

## 2023-02-15 NOTE — Patient Instructions (Signed)
   Focus and Read App and cards to help you read online when you have  This week, really focus on breathing more and catching yourself when you are pushing and straining - stop and breath and fix your voice  This is a long standing habit that you need to break - have your husband or friends let you know when you are straining  Speech should be easy - when you feel your neck and throat strain, stop, breath and relax those muscles  Keep up practicing your resonant voice with "mmm" and the straw exercises

## 2023-02-20 ENCOUNTER — Ambulatory Visit: Payer: BC Managed Care – PPO

## 2023-02-20 DIAGNOSIS — R471 Dysarthria and anarthria: Secondary | ICD-10-CM | POA: Diagnosis not present

## 2023-02-20 DIAGNOSIS — R498 Other voice and resonance disorders: Secondary | ICD-10-CM

## 2023-02-20 NOTE — Therapy (Signed)
OUTPATIENT SPEECH LANGUAGE PATHOLOGY TREATMENT NOTE   Patient Name: [name] MRN: [id number] DOB:09-18-1976, 47 y.o., female Today's Date: 02/20/2023  PCP: Cleatis Polka., MD REFERRING PROVIDER: Cleatis Polka., MD  END OF SESSION:   End of Session - 02/20/23 0942     Visit Number 7    Number of Visits 25    Date for SLP Re-Evaluation 04/12/23    SLP Start Time 0932    SLP Stop Time  1015    SLP Time Calculation (min) 43 min    Activity Tolerance Patient tolerated treatment well             Past Medical History:  Diagnosis Date   Allergy    Chronic headaches    Eosinophilic esophagitis    GERD (gastroesophageal reflux disease)    Metacarpal bone fracture    4th and 5th fingers   Seizure    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   has slight rt sided weakness   Past Surgical History:  Procedure Laterality Date   ACHILLES TENDON SURGERY Bilateral    BRAIN SURGERY     Stent placed and removed   CESAREAN SECTION     x3   ENDOMETRIAL ABLATION W/ NOVASURE  11/08/1999   OPEN REDUCTION INTERNAL FIXATION (ORIF) METACARPAL Right 03/14/2014   Procedure: OPEN REDUCTION INTERNAL FIXATION (ORIF) RIGHT FOURTH AND FIFTH METACARPAL;  Surgeon: Dominica Severin, MD;  Location: [facility];  Service: Orthopedics;  Laterality: Right;   STRABISMUS SURGERY     TOE SURGERY Right    TRACHEOSTOMY  11/07/1997   UPPER GASTROINTESTINAL ENDOSCOPY     Patient Active Problem List   Diagnosis Date Noted   Hoarseness 05/23/2022   Chronic cough 02/08/2022   Dysphagia 02/08/2022    ONSET DATE: 01/04/2023  REFERRING DIAG: R13.12 (ICD-10-CM) - Dysphagia, oropharyngeal phase  THERAPY DIAG:  Dysarthria and anarthria  Other voice and resonance disorders  Rationale for Evaluation and Treatment: Rehabilitation  SUBJECTIVE:   SUBJECTIVE STATEMENT: "I'm not doing my exercises like I should be; I just forget"   PAIN: Are you having pain? No  OBJECTIVE:  MBSS  WNL, stroboscopy - WNL  TODAY'S TREATMENT:                                                                                                                                         DATE:   02/20/23: Pt entered with "throaty voice" characterized by hoarse and strained vocal quality. Pt reported she has been inconsistent with completing her exercises, although she tries to in the car. SLP facilitated discussion on how pt can remember to complete HEP. Dicussed importance of being consistent in order to generalize to conversation.  Given model and frequent min-A, pt accurately completed warm ups and SOVT exercises. The practiced resonant voice therapy at word to sentence level, requiring frequent min-A to correct. Pt independently  self-corrected about 50% of the time. During conversation, pt required usual cues to self-correct strained vocal quality and implement strategies and appropriate breath support.  Pt benefits from breaking up utterances into short phrases to achieve sufficient breath support for vocal clarity. Pt requires frequent visual cues to take a deep breath when her voice becomes hoarse. When told "Imagine that the last word in a sentence is written in all caps/bold/emphasized", pt's voice did not fade at the end as it usually does.   4/10/24[name] enters with hoarse, strained voice. Semi occluded vocal tract exercises with straw and resonant voice exercises with tongue in and out - she achieves clear, easy phonation with "m" before phrases. In structured task generating 3 sentence descriptions using resonant voice she achieved clear phonation 15/20 sentences with occasional min A - verbal cues and modeling. In conversation [name] required frequent mod A to ID and self correct talking on residual air with strain and hoarseness. She is quite talkative and falls back into strained voice and talking on residual air frequently  02/13/23: Pt with clear voice in waiting room, but worsened vocal quality  (breathy, rough, strained) after entering therapy room. Given frequent model from SLP, pt completed resonant voice exercises as warm ups. Pt required frequent model and min-A to correct poor vocal quality when reading sentences, demonstrated clear vocal quality in 65% of opportunities. In discourse, pt again required frequent min-A to maintain clear voice.  When pt's voice reduced in quality, pt reached 67 dB volume, although reached 70 dB in conversation when she used clear vocal quality.   02/08/23: Pt entered with breathy, rough, strained voice. Continued educating pt on purpose and benefit of resonant voice therapy. Given consistent model from SLP, pt completed resonant voice exercises. With occasional min-A, pt accurately performed exercises, demonstrating improvement in vocal quality and reduction in rough and strained voice. Transitioned from word level to longer utterances (phrase, sentences). At discourse level, pt required more frequent verbal cues, eventually faded to occasional min-A.  HEP: Continue resonant voice exercises at home  01/25/23:  [name] reports some improvement in her voice. Introduced Tourist information centre manager therapy - tongue out hums, glides and phrases transitioned to tongue  /m/ before words (Ok, fine, sure etc) then to longer phrases and personally relevant phrases. Beginning phrases with nasal /m/ results in clear, strong phonation and [name] more ease in phonation with rare min A. In structured task, generating 1 sentence description, [name] carried over resonant voice 7/10x with usual verbal cues and modeling. In conversation, max A for carryover and for adequate breath support when hoarse, breathy voice results from speaking on residual air. Added resonant voice exercises to HEP and generated list of personally relevant phrases /sentences to practice with resonant voice, beginning each with /m/. See patient instructions.   01/23/23: [name] has consistently completed HEP of SOVTE. She  enters with breathy, rough, strained voice. Loud ah 5x with clear, easy phonation and volume 80dB with rare min A. Modified this into Ah followed by short phrases, again with clear phonation achieved. Pitch glides again WNL with clear phonation and WNL dB. Instructed her to use adequate breath support and when she hears breathy, strained voice to take a breath and reset clear voice. In 10 word sentence reading, Shemariah required usual min verbal cues to ID voice change from clear to strained/breathy. Ongoing cues for breath support. With cues, Mayren able to correct harsh voice to more clear phonation with speaking with intent and volume. In structured  task, Shanyiah used intent to achieve clear phonation 70% of sentences when generating  3 sentence description of 10 basic objects. Marlinda expressed concern that speaking this way takes effort - education provided that she will have to make effort for a clearer loud voice initially. Introduced over articulation, which she is familiar with. Numa feels un-natural and like she is "talking down" to a person when she uses this strategy. Will address this in future sessions, to try to practice so over articulation sounds more natural.   01/18/23: Initiated training in semi-occluded vocal tract exercises (SOVTE) and flow phonation. Samora demonstrated SOVTE with rare min A. Flow phonation with usual mod A. She will complete SOVTE 2x a day - see pt instructions     PATIENT EDUCATION: Education details: see today's treatment, patient instructions, HEP for voice/dysarthria, compensations for voice/dysarthria Person educated: Patient Education method: Explanation, Demonstration, Verbal cues, and Handouts Education comprehension: returned demonstration, verbal cues required, and needs further education   HOME EXERCISE PROGRAM: SOVTE, flow phonation, resonant voice therapy   GOALS: Goals reviewed with patient? Yes   SHORT TERM GOALS: Target date: 02/15/23   Pt will complete HEP  for voice/dysarthria with rare min A over 2 sessions Baseline: Goal status: ONGOING   2.  Pt will utilize flow phonation or resonant voice therapy to achieve clear phonation 18/20 phrases Baseline:  Goal status: PARTIALLY MET   3.  Pt will maintain volume and voice with out speaking on residual air over 18/20 10-12 word sentences Baseline:  Goal status: NOT MET   4.  Pt will self correct dysphonia/dysarthria with occasional min A 18/20 sentences Baseline:  Goal status: PARTIALLY MET     LONG TERM GOALS: Target date: 04/12/23   Pt will achieve clear, forward focus phonation over 10 minute conversation with rare min A Baseline:  Goal status: ONGOING   2.  Pt will complete HEP with mod I Baseline:  Goal status: ONGOING   3.  Pt will be 90% intelligible over 10 minute conversation in noisy environment Baseline:  Goal status: ONGOING   4.  Pt will improve score on VHI by 3 points Baseline:  Goal status: ONGOING     ASSESSMENT:   CLINICAL IMPRESSION: Patient is a 47 y.o. female who was seen today for voice therapy. Continued instructing pt in resonant voice therapy exercises. Worked up from word level to discourse level. Pt inconsistently completing HEP and demonstrating improved vocal quality in sessions with usual modeling and cues. I continue to recommend skilled ST to maximize her intelligibility for safety, QOL and to reduce family burden.    OBJECTIVE IMPAIRMENTS: include dysarthria and voice disorder. These impairments are limiting patient from effectively communicating at home and in community and safety when swallowing. Factors affecting potential to achieve goals and functional outcome are previous level of function and time post onset .Marland Kitchen Patient will benefit from skilled SLP services to address above impairments and improve overall function.   REHAB POTENTIAL: Fair due to time post onset   PLAN:   SLP FREQUENCY: 2x/week   SLP DURATION: 12 weeks   PLANNED  INTERVENTIONS: Aspiration precaution training, Diet toleration management , Environmental controls, Trials of upgraded texture/liquids, Internal/external aids, Functional tasks, Multimodal communication approach, and SLP instruction and feedback          Cephus Shelling, Student-SLP 02/20/2023, 10:11 AM

## 2023-02-22 ENCOUNTER — Encounter: Payer: Self-pay | Admitting: Speech Pathology

## 2023-02-22 ENCOUNTER — Ambulatory Visit: Payer: BC Managed Care – PPO | Admitting: Speech Pathology

## 2023-02-22 DIAGNOSIS — R471 Dysarthria and anarthria: Secondary | ICD-10-CM

## 2023-02-22 DIAGNOSIS — R498 Other voice and resonance disorders: Secondary | ICD-10-CM | POA: Diagnosis not present

## 2023-02-22 NOTE — Therapy (Signed)
OUTPATIENT SPEECH LANGUAGE PATHOLOGY TREATMENT NOTE   Patient Name: [name] MRN: [id number] DOB:[birth year], 47 y.o., female Today's Date: 02/22/2023  PCP: Cleatis Polka., MD REFERRING PROVIDER: Cleatis Polka., MD  END OF SESSION:   End of Session - 02/22/23 0858     Visit Number 8    Number of Visits 25    Date for SLP Re-Evaluation 04/12/23    SLP Start Time 0855    SLP Stop Time  0930    SLP Time Calculation (min) 35 min    Activity Tolerance Patient tolerated treatment well             Past Medical History:  Diagnosis Date   Allergy    Chronic headaches    Eosinophilic esophagitis    GERD (gastroesophageal reflux disease)    Metacarpal bone fracture    4th and 5th fingers   Seizure    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   has slight rt sided weakness   Past Surgical History:  Procedure Laterality Date   ACHILLES TENDON SURGERY Bilateral    BRAIN SURGERY     Stent placed and removed   CESAREAN SECTION     x3   ENDOMETRIAL ABLATION W/ NOVASURE  11/08/1999   OPEN REDUCTION INTERNAL FIXATION (ORIF) METACARPAL Right 03/14/2014   Procedure: OPEN REDUCTION INTERNAL FIXATION (ORIF) RIGHT FOURTH AND FIFTH METACARPAL;  Surgeon: Dominica Severin, MD;  Location: [facility];  Service: Orthopedics;  Laterality: Right;   STRABISMUS SURGERY     TOE SURGERY Right    TRACHEOSTOMY  11/07/1997   UPPER GASTROINTESTINAL ENDOSCOPY     Patient Active Problem List   Diagnosis Date Noted   Hoarseness 05/23/2022   Chronic cough 02/08/2022   Dysphagia 02/08/2022    ONSET DATE: 01/04/2023  REFERRING DIAG: R13.12 (ICD-10-CM) - Dysphagia, oropharyngeal phase  THERAPY DIAG:  Dysarthria and anarthria  Other voice and resonance disorders  Rationale for Evaluation and Treatment: Rehabilitation  SUBJECTIVE:   SUBJECTIVE STATEMENT: "Do you have anything to print out I can read"   PAIN: Are you having pain? No  OBJECTIVE:  MBSS WNL,  stroboscopy - WNL  TODAY'S TREATMENT:                                                                                                                                         DATE:   02/22/23: She enters with hoarse and strained voice. She completed resonant voice exercises with rare min A achieving clear phonation. Focused on feeling forward  focused phonation in resonant phrases and sentences 12/12x. Added flow phonation with flow syllables, phrases and she achieved clear phonation - when advanced to flow sentences Malillany required occasional mod A to correct hoarse voice. In 5-7 word sentence reading focusing on flow and forward phonation to correct hoarse voice. She achieved clear phonation 7/10 sentences. In 8-9 word sentences  initiating with /m/ for resonant voice to achieve clear phonation 6/10 sentences. In conversation, she reverts back to strain voice talking in residual air. Emphasized need to practice and correct this at home  02/20/23: Pt entered with "throaty voice" characterized by hoarse and strained vocal quality. Pt reported she has been inconsistent with completing her exercises, although she tries to in the car. SLP facilitated discussion on how pt can remember to complete HEP. Dicussed importance of being consistent in order to generalize to conversation.  Given model and frequent min-A, pt accurately completed warm ups and SOVT exercises. The practiced resonant voice therapy at word to sentence level, requiring frequent min-A to correct. Pt independently self-corrected about 50% of the time. During conversation, pt required usual cues to self-correct strained vocal quality and implement strategies and appropriate breath support.  Pt benefits from breaking up utterances into short phrases to achieve sufficient breath support for vocal clarity. Pt requires frequent visual cues to take a deep breath when her voice becomes hoarse. When told "Imagine that the last word in a sentence is written in  all caps/bold/emphasized", pt's voice did not fade at the end as it usually does.   4/10/24[name] enters with hoarse, strained voice. Semi occluded vocal tract exercises with straw and resonant voice exercises with tongue in and out - she achieves clear, easy phonation with "m" before phrases. In structured task generating 3 sentence descriptions using resonant voice she achieved clear phonation 15/20 sentences with occasional min A - verbal cues and modeling. In conversation Shalini required frequent mod A to ID and self correct talking on residual air with strain and hoarseness. She is quite talkative and falls back into strained voice and talking on residual air frequently  02/13/23: Pt with clear voice in waiting room, but worsened vocal quality (breathy, rough, strained) after entering therapy room. Given frequent model from SLP, pt completed resonant voice exercises as warm ups. Pt required frequent model and min-A to correct poor vocal quality when reading sentences, demonstrated clear vocal quality in 65% of opportunities. In discourse, pt again required frequent min-A to maintain clear voice.  When pt's voice reduced in quality, pt reached 67 dB volume, although reached 70 dB in conversation when she used clear vocal quality.   02/08/23: Pt entered with breathy, rough, strained voice. Continued educating pt on purpose and benefit of resonant voice therapy. Given consistent model from SLP, pt completed resonant voice exercises. With occasional min-A, pt accurately performed exercises, demonstrating improvement in vocal quality and reduction in rough and strained voice. Transitioned from word level to longer utterances (phrase, sentences). At discourse level, pt required more frequent verbal cues, eventually faded to occasional min-A.  HEP: Continue resonant voice exercises at home  01/25/23:  [name] reports some improvement in her voice. Introduced Tourist information centre manager therapy - tongue out hums, glides and  phrases transitioned to tongue  /m/ before words (Ok, fine, sure etc) then to longer phrases and personally relevant phrases. Beginning phrases with nasal /m/ results in clear, strong phonation and Merrell Id's more ease in phonation with rare min A. In structured task, generating 1 sentence description, Aliviana carried over resonant voice 7/10x with usual verbal cues and modeling. In conversation, max A for carryover and for adequate breath support when hoarse, breathy voice results from speaking on residual air. Added resonant voice exercises to HEP and generated list of personally relevant phrases /sentences to practice with resonant voice, beginning each with /m/. See patient instructions.   01/23/23:  Allegra has consistently completed HEP of SOVTE. She enters with breathy, rough, strained voice. Loud ah 5x with clear, easy phonation and volume 80dB with rare min A. Modified this into Ah followed by short phrases, again with clear phonation achieved. Pitch glides again WNL with clear phonation and WNL dB. Instructed her to use adequate breath support and when she hears breathy, strained voice to take a breath and reset clear voice. In 10 word sentence reading, Nichole required usual min verbal cues to ID voice change from clear to strained/breathy. Ongoing cues for breath support. With cues, Nalini able to correct harsh voice to more clear phonation with speaking with intent and volume. In structured task, Addeline used intent to achieve clear phonation 70% of sentences when generating  3 sentence description of 10 basic objects. Mikaiah expressed concern that speaking this way takes effort - education provided that she will have to make effort for a clearer loud voice initially. Introduced over articulation, which she is familiar with. Shaqueena feels un-natural and like she is "talking down" to a person when she uses this strategy. Will address this in future sessions, to try to practice so over articulation sounds more natural.    01/18/23: Initiated training in semi-occluded vocal tract exercises (SOVTE) and flow phonation. Rajean demonstrated SOVTE with rare min A. Flow phonation with usual mod A. She will complete SOVTE 2x a day - see pt instructions     PATIENT EDUCATION: Education details: see today's treatment, patient instructions, HEP for voice/dysarthria, compensations for voice/dysarthria Person educated: Patient Education method: Explanation, Demonstration, Verbal cues, and Handouts Education comprehension: returned demonstration, verbal cues required, and needs further education   HOME EXERCISE PROGRAM: SOVTE, flow phonation, resonant voice therapy   GOALS: Goals reviewed with patient? Yes   SHORT TERM GOALS: Target date: 02/15/23   Pt will complete HEP for voice/dysarthria with rare min A over 2 sessions Baseline: Goal status: ONGOING   2.  Pt will utilize flow phonation or resonant voice therapy to achieve clear phonation 18/20 phrases Baseline:  Goal status: PARTIALLY MET   3.  Pt will maintain volume and voice with out speaking on residual air over 18/20 10-12 word sentences Baseline:  Goal status: NOT MET   4.  Pt will self correct dysphonia/dysarthria with occasional min A 18/20 sentences Baseline:  Goal status: PARTIALLY MET     LONG TERM GOALS: Target date: 04/12/23   Pt will achieve clear, forward focus phonation over 10 minute conversation with rare min A Baseline:  Goal status: ONGOING   2.  Pt will complete HEP with mod I Baseline:  Goal status: ONGOING   3.  Pt will be 90% intelligible over 10 minute conversation in noisy environment Baseline:  Goal status: ONGOING   4.  Pt will improve score on VHI by 3 points Baseline:  Goal status: ONGOING     ASSESSMENT:   CLINICAL IMPRESSION: Patient is a 47 y.o. female who was seen today for voice therapy. Continued instructing pt in resonant voice therapy exercises. Worked up from word level to discourse level. Pt  inconsistently completing HEP and demonstrating improved vocal quality in sessions with usual modeling and cues. I continue to recommend skilled ST to maximize her intelligibility for safety, QOL and to reduce family burden.    OBJECTIVE IMPAIRMENTS: include dysarthria and voice disorder. These impairments are limiting patient from effectively communicating at home and in community and safety when swallowing. Factors affecting potential to achieve goals and functional outcome are  previous level of function and time post onset .Marland Kitchen Patient will benefit from skilled SLP services to address above impairments and improve overall function.   REHAB POTENTIAL: Fair due to time post onset   PLAN:   SLP FREQUENCY: 2x/week   SLP DURATION: 12 weeks   PLANNED INTERVENTIONS: Aspiration precaution training, Diet toleration management , Environmental controls, Trials of upgraded texture/liquids, Internal/external aids, Functional tasks, Multimodal communication approach, and SLP instruction and feedback          Kataya Guimont, Radene Journey, CCC-SLP 02/22/2023, 11:03 AM

## 2023-02-23 DIAGNOSIS — S46212A Strain of muscle, fascia and tendon of other parts of biceps, left arm, initial encounter: Secondary | ICD-10-CM | POA: Diagnosis not present

## 2023-02-23 DIAGNOSIS — M25512 Pain in left shoulder: Secondary | ICD-10-CM | POA: Diagnosis not present

## 2023-02-23 DIAGNOSIS — S46011A Strain of muscle(s) and tendon(s) of the rotator cuff of right shoulder, initial encounter: Secondary | ICD-10-CM | POA: Diagnosis not present

## 2023-02-27 ENCOUNTER — Ambulatory Visit: Payer: BC Managed Care – PPO | Admitting: Speech Pathology

## 2023-03-01 ENCOUNTER — Ambulatory Visit: Payer: BC Managed Care – PPO | Admitting: Speech Pathology

## 2023-03-01 ENCOUNTER — Encounter: Payer: Self-pay | Admitting: Internal Medicine

## 2023-03-01 ENCOUNTER — Ambulatory Visit (INDEPENDENT_AMBULATORY_CARE_PROVIDER_SITE_OTHER): Payer: BC Managed Care – PPO | Admitting: Internal Medicine

## 2023-03-01 VITALS — BP 118/78 | HR 59 | Ht 65.0 in | Wt 192.0 lb

## 2023-03-01 DIAGNOSIS — R1310 Dysphagia, unspecified: Secondary | ICD-10-CM | POA: Diagnosis not present

## 2023-03-01 DIAGNOSIS — K200 Eosinophilic esophagitis: Secondary | ICD-10-CM

## 2023-03-01 DIAGNOSIS — Z1211 Encounter for screening for malignant neoplasm of colon: Secondary | ICD-10-CM | POA: Diagnosis not present

## 2023-03-01 MED ORDER — NA SULFATE-K SULFATE-MG SULF 17.5-3.13-1.6 GM/177ML PO SOLN: ORAL | 0 refills | Status: DC

## 2023-03-01 MED ORDER — DEXLANSOPRAZOLE 60 MG PO CPDR: 60.0000 mg | DELAYED_RELEASE_CAPSULE | Freq: Every day | ORAL | 3 refills | Status: DC

## 2023-03-01 NOTE — Patient Instructions (Addendum)
You have been scheduled for an endoscopy and colonoscopy. Please follow the written instructions given to you at your visit today. Please pick up your prep supplies at the pharmacy within the next 1-3 days. If you use inhalers (even only as needed), please bring them with you on the day of your procedure.   We have sent the following medications to your pharmacy for you to pick up at your convenience: Suprep _______________________________________________________  If your blood pressure at your visit was 140/90 or greater, please contact your primary care physician to follow up on this.  _______________________________________________________  If you are age 20 or older, your body mass index should be between 23-30. Your Body mass index is 31.95 kg/m. If this is out of the aforementioned range listed, please consider follow up with your Primary Care Provider.  If you are age 46 or younger, your body mass index should be between 19-25. Your Body mass index is 31.95 kg/m. If this is out of the aformentioned range listed, please consider follow up with your Primary Care Provider.   ________________________________________________________  The [facility] providers would like to encourage you to use [facility] to communicate with providers for non-urgent requests or questions.  Due to long hold times on the telephone, sending your provider a message by [facility] may be a faster and more efficient way to get a response.  Please allow 48 business hours for a response.  Please remember that this is for non-urgent requests.  _______________________________________________________   Due to recent changes in healthcare laws, you may see the results of your imaging and laboratory studies on MyChart before your provider has had a chance to review them.  We understand that in some cases there may be results that are confusing or concerning to you. Not all laboratory results come back in the same time frame and  the provider may be waiting for multiple results in order to interpret others.  Please give [place] 48 hours in order for your provider to thoroughly review all the results before contacting the office for clarification of your results.    Thank you for entrusting me with your care and for choosing [facility], Dr. Eulah Pont

## 2023-03-01 NOTE — Progress Notes (Unsigned)
Chief Complaint: Eosinophilic esophagitis  HPI : 47 year old female with history of TBI, siezures, headaches presents for follow up of EoE  Interval History: Sometimes she will eat and then cough up food every few weeks. She never started Dupixent therapy because her regular pharmacy noted that it was too expensive, and she never had the prescription sent a specialty pharmacy. She is interested in getting a colonoscopy for colon cancer screening since she just turned 47. Denies blood in stools, changes in bowel habits, and unintentional weight loss. Denies famliy history of colon cancer. She has never colonoscopy ebefore. She is taking her Dexilant QD.  Wt Readings from Last 3 Encounters:  03/01/23 192 lb (87.1 kg)  10/25/22 193 lb 6 oz (87.7 kg)  09/13/22 194 lb (88 kg)   Current Outpatient Medications  Medication Sig Dispense Refill   diclofenac (VOLTAREN) 75 MG EC tablet Take 75 mg by mouth 2 (two) times daily.     dexlansoprazole (DEXILANT) 60 MG capsule Take 1 capsule (60 mg total) by mouth daily. (Patient not taking: Reported on 01/18/2023) 90 capsule 3   naproxen sodium (ALEVE) 220 MG tablet Take 440 mg by mouth as needed.     No current facility-administered medications for this visit.   Review of Systems: All systems reviewed and negative except where noted in HPI.   Physical Exam: BP 118/78   Pulse (!) 59   Ht  (1.651 m)   Wt 192 lb (87.1 kg)   BMI 31.95 kg/m  Constitutional: Pleasant,well-developed, female in no acute distress. HEENT: Normocephalic and atraumatic. Conjunctivae are normal. No scleral icterus. Prior tracheostomy scar present. Cardiovascular: Normal rate, regular rhythm.  Pulmonary/chest: Effort normal and breath sounds normal. No wheezing, rales or rhonchi. Abdominal: Soft, nondistended, nontender. Bowel sounds active throughout. There are no masses palpable. No hepatomegaly. Extremities: No edema Neurological: Alert and oriented to person place  and time. Skin: Skin is warm and dry. No rashes noted. Psychiatric: Normal mood and affect. Behavior is normal.  ENT 01/14/22: Normal head neck exam. History of tracheostomy may be irrelevant. Recommend evaluation by a pulmonologist for chronic cough. She might benefit with flexible bronchoscopy to evaluate the tracheostomy site. Follow-up as needed.  Speech swallow study 02/21/22: Pt demonstrates normal orpharyngeal swallowing ability. There is no dysphagia present, no coughing during study. Esophageal sweep with barium tablet appeared WNL though no radiologist present to confirm. 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 Pt may benefit from f/u with SLP for both interventions targeting cough suppression and/or vocal hygiene and voice therapy.  EGD 06/09/22: - White nummular lesions in esophageal mucosa. Biopsied. - Acquired deformity (prior G-tube site) in the gastric body. - Duodenitis. Biopsied. - Biopsies were taken with a cold forceps for Helicobacter pylori testing. Path: 1. Surgical [P], duodenal - ACTIVE DUODENITIS CONSISTENT WITH PEPTIC INJURY. - NO INCREASED INTRAEPITHELIAL LYMPHOCYTES OR VILLOUS ATROPHY. 2. Surgical [P], gastric - ANTRAL AND OXYNTIC MUCOSA WITH NO SIGNIFICANT PATHOLOGIC CHANGES. - NO HELICOBACTER PYLORI IDENTIFIED. 3. Surgical [P], esophagus - SQUAMOUS MUCOSA WITH INCREASED EOSINOPHILS. - GREATER THAN 20 PER HIGH-POWER FIELD. - PAS STAIN FOR FUNGUS IS NEGATIVE.  EGD 09/13/22: - Esophageal mucosal changes suspicious for eosinophilic esophagitis. - Acquired deformity in the gastric body from prior G-tube. - Duodenitis. Biopsied. - Biopsies were taken with a cold forceps for evaluation of eosinophilic esophagitis. Path: 1. Surgical [P], distal esophagus - SQUAMOUS MUCOSA WITH  PROMINENT BASAL CELL HYPERPLASIA AND INCREASED INTRAEPITHELIAL EOSINOPHILS (TOO NUMEROUS TO COUNT),  SEE PART 2. 2. Surgical [P], proximal esophagus - SQUAMOUS MUCOSA WITH BASAL CELL HYPERPLASIA AND SIGNIFICANTLY INCREASED INTRAEPITHELIAL EOSINOPHILS (TOO NUMEROUS TO COUNT) CONSISTENT WITH EOSINOPHILIC ESOPHAGITIS IN THE APPROPRIATE CLINICAL/ENDOSCOPIC SETTING.  ASSESSMENT AND PLAN: Eosinophilic esophagitis Dysphagia GERD Patient is still having some occasional dysphagia but never started Dupixent because she wants to know if the Dexilant is sufficient to keep her symptoms under control. I do think that it is low likelihood that she has PPI responsive EoE since her last EGD was done while the patient was partially taking PPI therapy and showed response at that time. However, the Dexilant has been easier for her to take compared to the twice daily PPIs that she was instructed to take before her last EGD. Thus will plan for an EGD for further evaluation. Patient is also due for colon cancer screening so will get her scheduled for this as well - Cont Dexilant 60 mg QD - EGD/colonoscopy [facility] - RTC 2-3 months  Alicia Pont, MD  I spent 32 minutes of time, including in depth chart review, independent review of results as outlined above, communicating results with the patient directly, face-to-face time with the patient, coordinating care, and ordering studies and medications as appropriate, and documentation.

## 2023-03-06 ENCOUNTER — Ambulatory Visit: Payer: BC Managed Care – PPO | Admitting: Speech Pathology

## 2023-03-06 DIAGNOSIS — R498 Other voice and resonance disorders: Secondary | ICD-10-CM

## 2023-03-06 DIAGNOSIS — R471 Dysarthria and anarthria: Secondary | ICD-10-CM | POA: Diagnosis not present

## 2023-03-06 NOTE — Patient Instructions (Signed)
   Speech should not be as effortful as  it is for you - let it flow out, don't forcefully push - this is not how we talk  Be aware of when you are holding your breath and voice in your throat, rather than letting the air flow out of your mouth  You have to think about how you talk - be mindful of feeling air flow on your flow sounds (s,z,ch,sh,th,p,b,f,v, h) and feel the air on your lips  This week - practice conversing using m and air flow out - you have to be mindful of this - yes you have to think about it  Pay attention to how often other people breathe in conversation - watch for this. We all breathe frequently during conversation - you feel self conscious breathing while talking but this is crucial for making your speech more clear and less effortful. Use breath to power your voice, don't push with your throat - you are working too hard

## 2023-03-06 NOTE — Therapy (Signed)
OUTPATIENT SPEECH LANGUAGE PATHOLOGY TREATMENT NOTE   Patient Name: [name] MRN: [id number] DOB:05-17-1976, 47 y.o., female Today's Date: 03/06/2023  PCP: Cleatis Polka., MD REFERRING PROVIDER: Cleatis Polka., MD  END OF SESSION:   End of Session - 03/06/23 4098     Visit Number 9    Number of Visits 25    Date for SLP Re-Evaluation 04/12/23    SLP Start Time 0936   pt arrived late   SLP Stop Time  1015    SLP Time Calculation (min) 39 min    Activity Tolerance Patient tolerated treatment well             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   has slight rt sided weakness   Past Surgical History:  Procedure Laterality Date   ACHILLES TENDON SURGERY Bilateral    BRAIN SURGERY     Stent placed and removed   CESAREAN SECTION     x3   ENDOMETRIAL ABLATION W/ NOVASURE  11/08/1999   OPEN REDUCTION INTERNAL FIXATION (ORIF) METACARPAL Right 03/14/2014   Procedure: OPEN REDUCTION INTERNAL FIXATION (ORIF) RIGHT FOURTH AND FIFTH METACARPAL;  Surgeon: Dominica Severin, MD;  Location: [facility];  Service: Orthopedics;  Laterality: Right;   STRABISMUS SURGERY     TOE SURGERY Right    TRACHEOSTOMY  11/07/1997   UPPER GASTROINTESTINAL ENDOSCOPY     Patient Active Problem List   Diagnosis Date Noted   Hoarseness 05/23/2022   Chronic cough 02/08/2022   Dysphagia 02/08/2022    ONSET DATE: 01/04/2023  REFERRING DIAG: R13.12 (ICD-10-CM) - Dysphagia, oropharyngeal phase  THERAPY DIAG:  Dysarthria and anarthria  Other voice and resonance disorders  Rationale for Evaluation and Treatment: Rehabilitation  SUBJECTIVE:   SUBJECTIVE STATEMENT: "The past couple of weeks my voice has been horse again"   PAIN: Are you having  pain? No  OBJECTIVE:  MBSS WNL, stroboscopy - WNL  TODAY'S TREATMENT:                                                                                                                                         DATE:   03/06/23: Pt enters with horse voice - reset speech/voice with SOVTE and resonant voice exercies - using resonant m prior to reading 7 word sentences, she achieves clear phonation. Recorded her reading with cues to focus on breath support and flow sounds vs conversation. With min cues, [name] her strong reading voice vs her strained speech. She continues to require cues to use adequate breath support during conversation. She stated she feels self conscious stopping to breathe  in conversation. This week she is to observe how frequently others breath during conversation. She is to use intent and be mindful of how she is using her voice during conversation. In conversation today, [name] Rocha strained speech and self corrected with usual mod A.   02/22/23: She enters with hoarse and strained voice. She completed resonant voice exercises with rare min A achieving clear phonation. Focused on feeling forward  focused phonation in resonant phrases and sentences 12/12x. Added flow phonation with flow syllables, phrases and she achieved clear phonation - when advanced to flow sentences [name] required occasional mod A to correct hoarse voice. In 5-7 word sentence reading focusing on flow and forward phonation to correct hoarse voice. She achieved clear phonation 7/10 sentences. In 8-9 word sentences initiating with /m/ for resonant voice to achieve clear phonation 6/10 sentences. In conversation, she reverts back to strain voice talking in residual air. Emphasized need to practice and correct this at home  02/20/23: Pt entered with "throaty voice" characterized by hoarse and strained vocal quality. Pt reported she has been inconsistent with completing her exercises, although she tries to in the car. SLP  facilitated discussion on how pt can remember to complete HEP. Dicussed importance of being consistent in order to generalize to conversation.  Given model and frequent min-A, pt accurately completed warm ups and SOVT exercises. The practiced resonant voice therapy at word to sentence level, requiring frequent min-A to correct. Pt independently self-corrected about 50% of the time. During conversation, pt required usual cues to self-correct strained vocal quality and implement strategies and appropriate breath support.  Pt benefits from breaking up utterances into short phrases to achieve sufficient breath support for vocal clarity. Pt requires frequent visual cues to take a deep breath when her voice becomes hoarse. When told "Imagine that the last word in a sentence is written in all caps/bold/emphasized", pt's voice did not fade at the end as it usually does.   4/10/24[name] enters with hoarse, strained voice. Semi occluded vocal tract exercises with straw and resonant voice exercises with tongue in and out - she achieves clear, easy phonation with "m" before phrases. In structured task generating 3 sentence descriptions using resonant voice she achieved clear phonation 15/20 sentences with occasional min A - verbal cues and modeling. In conversation [name] required frequent mod A to ID and self correct talking on residual air with strain and hoarseness. She is quite talkative and falls back into strained voice and talking on residual air frequently  02/13/23: Pt with clear voice in waiting room, but worsened vocal quality (breathy, rough, strained) after entering therapy room. Given frequent model from SLP, pt completed resonant voice exercises as warm ups. Pt required frequent model and min-A to correct poor vocal quality when reading sentences, demonstrated clear vocal quality in 65% of opportunities. In discourse, pt again required frequent min-A to maintain clear voice.  When pt's voice reduced in  quality, pt reached 67 dB volume, although reached 70 dB in conversation when she used clear vocal quality.   02/08/23: Pt entered with breathy, rough, strained voice. Continued educating pt on purpose and benefit of resonant voice therapy. Given consistent model from SLP, pt completed resonant voice exercises. With occasional min-A, pt accurately performed exercises, demonstrating improvement in vocal quality and reduction in rough and strained voice. Transitioned from word level to longer utterances (phrase, sentences). At discourse level, pt required more frequent verbal cues, eventually faded to occasional min-A.  HEP: Continue resonant voice exercises  at home       PATIENT EDUCATION: Education details: see today's treatment, patient instructions, HEP for voice/dysarthria, compensations for voice/dysarthria Person educated: Patient Education method: Explanation, Demonstration, Verbal cues, and Handouts Education comprehension: returned demonstration, verbal cues required, and needs further education   HOME EXERCISE PROGRAM: SOVTE, flow phonation, resonant voice therapy   GOALS: Goals reviewed with patient? Yes   SHORT TERM GOALS: Target date: 02/15/23   Pt will complete HEP for voice/dysarthria with rare min A over 2 sessions Baseline: Goal status: MET   2.  Pt will utilize flow phonation or resonant voice therapy to achieve clear phonation 18/20 phrases Baseline:  Goal status: PARTIALLY MET   3.  Pt will maintain volume and voice with out speaking on residual air over 18/20 10-12 word sentences Baseline:  Goal status: MET   4.  Pt will self correct dysphonia/dysarthria with occasional min A 18/20 sentences Baseline:  Goal status: PARTIALLY MET     LONG TERM GOALS: Target date: 04/12/23   Pt will achieve clear, forward focus phonation over 10 minute conversation with rare min A Baseline:  Goal status: ONGOING   2.  Pt will complete HEP with mod I Baseline:  Goal status:  ONGOING   3.  Pt will be 90% intelligible over 10 minute conversation in noisy environment Baseline:  Goal status: ONGOING   4.  Pt will improve score on VHI by 3 points Baseline:  Goal status: ONGOING     ASSESSMENT:   CLINICAL IMPRESSION: Patient is a 47 y.o. female who was seen today for voice therapy. Continued instructing pt in resonant voice therapy exercises. Worked up from word level to discourse level. She is completing HEP and demonstrating improved vocal quality in sessions with occasoinal modeling and cues in structured tasks. She continues to required onging usual mod A to use appropriate breath support and forward phonation in conversation, as she reverts to pushing out speech and voice on residual air. I continue to recommend skilled ST to maximize her intelligibility for safety, QOL and to reduce family burden.    OBJECTIVE IMPAIRMENTS: include dysarthria and voice disorder. These impairments are limiting patient from effectively communicating at home and in community and safety when swallowing. Factors affecting potential to achieve goals and functional outcome are previous level of function and time post onset .Marland Kitchen Patient will benefit from skilled SLP services to address above impairments and improve overall function.   REHAB POTENTIAL: Fair due to time post onset   PLAN:   SLP FREQUENCY: 2x/week   SLP DURATION: 12 weeks   PLANNED INTERVENTIONS: Aspiration precaution training, Diet toleration management , Environmental controls, Trials of upgraded texture/liquids, Internal/external aids, Functional tasks, Multimodal communication approach, and SLP instruction and feedback          Anneta Rounds, Radene Journey, CCC-SLP 03/06/2023, 10:30 AM

## 2023-03-08 ENCOUNTER — Ambulatory Visit: Payer: BC Managed Care – PPO | Attending: Internal Medicine | Admitting: Speech Pathology

## 2023-03-08 ENCOUNTER — Encounter: Payer: Self-pay | Admitting: Speech Pathology

## 2023-03-08 DIAGNOSIS — R498 Other voice and resonance disorders: Secondary | ICD-10-CM | POA: Diagnosis not present

## 2023-03-08 DIAGNOSIS — R471 Dysarthria and anarthria: Secondary | ICD-10-CM | POA: Diagnosis not present

## 2023-03-08 NOTE — Therapy (Signed)
OUTPATIENT SPEECH LANGUAGE PATHOLOGY TREATMENT NOTE   Patient Name: [name] MRN: [id number] DOB:[birth year], 47 y.o., female Today's Date: 03/08/2023  PCP: Cleatis Polka., MD REFERRING PROVIDER: Cleatis Polka., MD  END OF SESSION:   End of Session - 03/08/23 0940     Visit Number 10    Number of Visits 25    Date for SLP Re-Evaluation 04/12/23    SLP Start Time 0940    SLP Stop Time  1015    SLP Time Calculation (min) 35 min    Activity Tolerance Patient tolerated treatment well             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   has slight rt sided weakness   Past Surgical History:  Procedure Laterality Date   ACHILLES TENDON SURGERY Bilateral    BRAIN SURGERY     Stent placed and removed   CESAREAN SECTION     x3   ENDOMETRIAL ABLATION W/ NOVASURE  11/08/1999   OPEN REDUCTION INTERNAL FIXATION (ORIF) METACARPAL Right 03/14/2014   Procedure: OPEN REDUCTION INTERNAL FIXATION (ORIF) RIGHT FOURTH AND FIFTH METACARPAL;  Surgeon: Dominica Severin, MD;  Location: [facility];  Service: Orthopedics;  Laterality: Right;   STRABISMUS SURGERY     TOE SURGERY Right    TRACHEOSTOMY  11/07/1997   UPPER GASTROINTESTINAL ENDOSCOPY     Patient Active Problem List   Diagnosis Date Noted   Hoarseness 05/23/2022   Chronic cough 02/08/2022   Dysphagia 02/08/2022    ONSET DATE: 01/04/2023  REFERRING DIAG: R13.12 (ICD-10-CM) - Dysphagia, oropharyngeal phase  THERAPY DIAG:  Dysarthria and anarthria  Other voice and resonance disorders  Rationale for Evaluation and Treatment: Rehabilitation  SUBJECTIVE:   SUBJECTIVE STATEMENT: "They breathe a lot" re: HW to observe how frequently others breath in conversation  PAIN: Are you  having pain? No  OBJECTIVE:  MBSS WNL, stroboscopy - WNL  TODAY'S TREATMENT:                                                                                                                                         DATE:   03/08/23: [name] observed that others do breathe quite frequently when speaking. She reports she has mindful of her breath support during speech. Today we focused on speaking with no tension in her neck, throat, shoulders, and face using easy speech rather than her forced tense speech. In sentences she required occasional min A to use more frequent breathing and relaxed body. In conversation, [name] did demonstrate adequate breath support, catching her self when her voice broke or she felt strain  and correcting this with occasional min A. Voice did remain mildly horse, however strain, aphonia and voice breaks did not occur. She is to practice this easy speech with frequent breathing in conversations at home.   03/06/23: Pt enters with horse voice - reset speech/voice with SOVTE and resonant voice exercies - using resonant m prior to reading 7 word sentences, she achieves clear phonation. Recorded her reading with cues to focus on breath support and flow sounds vs conversation. With min cues, [name] her strong reading voice vs her strained speech. She continues to require cues to use adequate breath support during conversation. She stated she feels self conscious stopping to breathe in conversation. This week she is to observe how frequently others breath during conversation. She is to use intent and be mindful of how she is using her voice during conversation. In conversation today, [name] Rocha strained speech and self corrected with usual mod A.   02/22/23: She enters with hoarse and strained voice. She completed resonant voice exercises with rare min A achieving clear phonation. Focused on feeling forward  focused phonation in resonant phrases and sentences 12/12x. Added flow phonation with  flow syllables, phrases and she achieved clear phonation - when advanced to flow sentences [name] required occasional mod A to correct hoarse voice. In 5-7 word sentence reading focusing on flow and forward phonation to correct hoarse voice. She achieved clear phonation 7/10 sentences. In 8-9 word sentences initiating with /m/ for resonant voice to achieve clear phonation 6/10 sentences. In conversation, she reverts back to strain voice talking in residual air. Emphasized need to practice and correct this at home  02/20/23: Pt entered with "throaty voice" characterized by hoarse and strained vocal quality. Pt reported she has been inconsistent with completing her exercises, although she tries to in the car. SLP facilitated discussion on how pt can remember to complete HEP. Dicussed importance of being consistent in order to generalize to conversation.  Given model and frequent min-A, pt accurately completed warm ups and SOVT exercises. The practiced resonant voice therapy at word to sentence level, requiring frequent min-A to correct. Pt independently self-corrected about 50% of the time. During conversation, pt required usual cues to self-correct strained vocal quality and implement strategies and appropriate breath support.  Pt benefits from breaking up utterances into short phrases to achieve sufficient breath support for vocal clarity. Pt requires frequent visual cues to take a deep breath when her voice becomes hoarse. When told "Imagine that the last word in a sentence is written in all caps/bold/emphasized", pt's voice did not fade at the end as it usually does.   4/10/24[name] enters with hoarse, strained voice. Semi occluded vocal tract exercises with straw and resonant voice exercises with tongue in and out - she achieves clear, easy phonation with "m" before phrases. In structured task generating 3 sentence descriptions using resonant voice she achieved clear phonation 15/20 sentences with occasional  min A - verbal cues and modeling. In conversation [name] required frequent mod A to ID and self correct talking on residual air with strain and hoarseness. She is quite talkative and falls back into strained voice and talking on residual air frequently  02/13/23: Pt with clear voice in waiting room, but worsened vocal quality (breathy, rough, strained) after entering therapy room. Given frequent model from SLP, pt completed resonant voice exercises as warm ups. Pt required frequent model and min-A to correct poor vocal quality when reading sentences, demonstrated clear vocal quality in 65% of  opportunities. In discourse, pt again required frequent min-A to maintain clear voice.  When pt's voice reduced in quality, pt reached 67 dB volume, although reached 70 dB in conversation when she used clear vocal quality.   02/08/23: Pt entered with breathy, rough, strained voice. Continued educating pt on purpose and benefit of resonant voice therapy. Given consistent model from SLP, pt completed resonant voice exercises. With occasional min-A, pt accurately performed exercises, demonstrating improvement in vocal quality and reduction in rough and strained voice. Transitioned from word level to longer utterances (phrase, sentences). At discourse level, pt required more frequent verbal cues, eventually faded to occasional min-A.  HEP: Continue resonant voice exercises at home       PATIENT EDUCATION: Education details: see today's treatment, patient instructions, HEP for voice/dysarthria, compensations for voice/dysarthria Person educated: Patient Education method: Explanation, Demonstration, Verbal cues, and Handouts Education comprehension: returned demonstration, verbal cues required, and needs further education   HOME EXERCISE PROGRAM: SOVTE, flow phonation, resonant voice therapy   GOALS: Goals reviewed with patient? Yes   SHORT TERM GOALS: Target date: 02/15/23   Pt will complete HEP for  voice/dysarthria with rare min A over 2 sessions Baseline: Goal status: MET   2.  Pt will utilize flow phonation or resonant voice therapy to achieve clear phonation 18/20 phrases Baseline:  Goal status: PARTIALLY MET   3.  Pt will maintain volume and voice with out speaking on residual air over 18/20 10-12 word sentences Baseline:  Goal status: MET   4.  Pt will self correct dysphonia/dysarthria with occasional min A 18/20 sentences Baseline:  Goal status: PARTIALLY MET     LONG TERM GOALS: Target date: 04/12/23   Pt will achieve clear, forward focus phonation over 10 minute conversation with rare min A Baseline:  Goal status: ONGOING   2.  Pt will complete HEP with mod I Baseline:  Goal status: ONGOING   3.  Pt will be 90% intelligible over 10 minute conversation in noisy environment Baseline:  Goal status: ONGOING   4.  Pt will improve score on VHI by 3 points Baseline:  Goal status: ONGOING     ASSESSMENT:   CLINICAL IMPRESSION: Patient is a 47 y.o. female who was seen today for voice therapy. Continued instructing pt in resonant voice therapy exercises. Worked up from word level to discourse level. She is completing HEP and demonstrating improved vocal quality in sessions with occasoinal modeling and cues in structured tasks. She continues to required onging usual mod A to use appropriate breath support and forward phonation in conversation, as she reverts to pushing out speech and voice on residual air. I continue to recommend skilled ST to maximize her intelligibility for safety, QOL and to reduce family burden.    OBJECTIVE IMPAIRMENTS: include dysarthria and voice disorder. These impairments are limiting patient from effectively communicating at home and in community and safety when swallowing. Factors affecting potential to achieve goals and functional outcome are previous level of function and time post onset .Marland Kitchen Patient will benefit from skilled SLP services to  address above impairments and improve overall function.   REHAB POTENTIAL: Fair due to time post onset   PLAN:   SLP FREQUENCY: 2x/week   SLP DURATION: 12 weeks   PLANNED INTERVENTIONS: Aspiration precaution training, Diet toleration management , Environmental controls, Trials of upgraded texture/liquids, Internal/external aids, Functional tasks, Multimodal communication approach, and SLP instruction and feedback          Mirko Tailor, Radene Journey, CCC-SLP 03/08/2023,  10:25 AM

## 2023-03-08 NOTE — Patient Instructions (Addendum)
   Great job breathing more frequently in conversation today!! You are doing a better job being aware of when your voice breaks and you need to breath. Keep it up!!  Speech should be easy - we don't use our face, throat, shoulders neck to force our words out   This week, really focus on breathing, but also speaking with a relaxed face, throat, shoulders - focus on making speech easy and not forced - don't worry about volume - keep speech easy  Do your voice warm ups (straw exercises and M phrases)

## 2023-03-13 ENCOUNTER — Ambulatory Visit: Payer: BC Managed Care – PPO | Admitting: Speech Pathology

## 2023-03-15 ENCOUNTER — Ambulatory Visit: Payer: BC Managed Care – PPO | Admitting: Speech Pathology

## 2023-03-15 DIAGNOSIS — R471 Dysarthria and anarthria: Secondary | ICD-10-CM | POA: Diagnosis not present

## 2023-03-15 DIAGNOSIS — R498 Other voice and resonance disorders: Secondary | ICD-10-CM | POA: Diagnosis not present

## 2023-03-15 NOTE — Therapy (Signed)
OUTPATIENT SPEECH LANGUAGE PATHOLOGY TREATMENT NOTE   Patient Name: [name] MRN: [id number] DOB:05/02/1976, 47 y.o., female Today's Date: 03/15/2023  PCP: Cleatis Polka., MD REFERRING PROVIDER: Cleatis Polka., MD  END OF SESSION:   End of Session - 03/15/23 0935     Visit Number 11    Number of Visits 25    Date for SLP Re-Evaluation 04/12/23    SLP Start Time 0933    SLP Stop Time  1015    SLP Time Calculation (min) 42 min    Activity Tolerance Patient tolerated treatment well             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   has slight rt sided weakness   Past Surgical History:  Procedure Laterality Date   ACHILLES TENDON SURGERY Bilateral    BRAIN SURGERY     Stent placed and removed   CESAREAN SECTION     x3   ENDOMETRIAL ABLATION W/ NOVASURE  11/08/1999   OPEN REDUCTION INTERNAL FIXATION (ORIF) METACARPAL Right 03/14/2014   Procedure: OPEN REDUCTION INTERNAL FIXATION (ORIF) RIGHT FOURTH AND FIFTH METACARPAL;  Surgeon: Dominica Severin, MD;  Location: [facility];  Service: Orthopedics;  Laterality: Right;   STRABISMUS SURGERY     TOE SURGERY Right    TRACHEOSTOMY  11/07/1997   UPPER GASTROINTESTINAL ENDOSCOPY     Patient Active Problem List   Diagnosis Date Noted   Hoarseness 05/23/2022   Chronic cough 02/08/2022   Dysphagia 02/08/2022    ONSET DATE: 01/04/2023  REFERRING DIAG: R13.12 (ICD-10-CM) - Dysphagia, oropharyngeal phase  THERAPY DIAG:  Dysarthria and anarthria  Other voice and resonance disorders  Rationale for Evaluation and Treatment: Rehabilitation  SUBJECTIVE:   SUBJECTIVE STATEMENT: " I would give it a B+" re her voice improvement  PAIN: Are you having pain? No  OBJECTIVE:  MBSS  WNL, stroboscopy - WNL  TODAY'S TREATMENT:                                                                                                                                         DATE:    03/15/23: [name] notes improvement in her voice/speech- She has focused on achieving adequate breath support, not speaking on residual air as well as not straining her shoulders, neck and face when speaking. She has not consistently completed semi-occluded vocal tract exercises (SOVTE) SOVTE completed today in water with rare min A for volume. Resonant voice exercises with mod I - clear phonation. In oral reading task focusing on forward phonation, flow and speaking with intent, [name] achieved WNL volume (she feels too  loud with WNL volume) and clear phonation 15/17 sentences. In conversation, she demonstrated adequate breath support 85% of utterances, WNL volume and clear phonation 70%. [name] continues to require cues to ID and correct hoarse voice.   5/1/24[name] observed that others do breathe quite frequently when speaking. She reports she has mindful of her breath support during speech. Today we focused on speaking with no tension in her neck, throat, shoulders, and face using easy speech rather than her forced tense speech. In sentences she required occasional min A to use more frequent breathing and relaxed body. In conversation, [name] did demonstrate adequate breath support, catching her self when her voice broke or she felt strain and correcting this with occasional min A. Voice did remain mildly horse, however strain, aphonia and voice breaks did not occur. She is to practice this easy speech with frequent breathing in conversations at home.   03/06/23: Pt enters with horse voice - reset speech/voice with SOVTE and resonant voice exercies - using resonant m prior to reading 7 word sentences, she achieves clear phonation. Recorded her reading with cues to focus on breath support and flow sounds vs conversation. With min  cues, [name] Id'd her strong reading voice vs her strained speech. She continues to require cues to use adequate breath support during conversation. She stated she feels self conscious stopping to breathe in conversation. This week she is to observe how frequently others breath during conversation. She is to use intent and be mindful of how she is using her voice during conversation. In conversation today, [name] Id'd strained speech and self corrected with usual mod A.   02/22/23: She enters with hoarse and strained voice. She completed resonant voice exercises with rare min A achieving clear phonation. Focused on feeling forward  focused phonation in resonant phrases and sentences 12/12x. Added flow phonation with flow syllables, phrases and she achieved clear phonation - when advanced to flow sentences [name] required occasional mod A to correct hoarse voice. In 5-7 word sentence reading focusing on flow and forward phonation to correct hoarse voice. She achieved clear phonation 7/10 sentences. In 8-9 word sentences initiating with /m/ for resonant voice to achieve clear phonation 6/10 sentences. In conversation, she reverts back to strain voice talking in residual air. Emphasized need to practice and correct this at home  02/20/23: Pt entered with "throaty voice" characterized by hoarse and strained vocal quality. Pt reported she has been inconsistent with completing her exercises, although she tries to in the car. SLP facilitated discussion on how pt can remember to complete HEP. Dicussed importance of being consistent in order to generalize to conversation.  Given model and frequent min-A, pt accurately completed warm ups and SOVT exercises. The practiced resonant voice therapy at word to sentence level, requiring frequent min-A to correct. Pt independently self-corrected about 50% of the time. During conversation, pt required usual cues to self-correct strained vocal quality and implement strategies and  appropriate breath support.  Pt benefits from breaking up utterances into short phrases to achieve sufficient breath support for vocal clarity. Pt requires frequent visual cues to take a deep breath when her voice becomes hoarse. When told "Imagine that the last word in a sentence is written in all caps/bold/emphasized", pt's voice did not fade at the end as it usually does.   4/10/24[name] enters with hoarse, strained voice. Semi occluded vocal tract exercises with straw and resonant voice exercises with tongue in and out - she achieves clear, easy phonation with "  m" before phrases. In structured task generating 3 sentence descriptions using resonant voice she achieved clear phonation 15/20 sentences with occasional min A - verbal cues and modeling. In conversation Mack[place]ey required frequent mod A to ID and self correct talking on residual air with strain and hoarseness. She is quite talkative and falls back into strained voice and talking on residual air frequently  02/13/23: Pt with clear voice in waiting room, but worsened vocal quality (breathy, rough, strained) after entering therapy room. Given frequent model from SLP, pt completed resonant voice exercises as warm ups. Pt required frequent model and min-A to correct poor vocal quality when reading sentences, demonstrated clear vocal quality in 65% of opportunities. In discourse, pt again required frequent min-A to maintain clear voice.  When pt's voice reduced in quality, pt reached 67 dB volume, although reached 70 dB in conversation when she used clear vocal quality.       PATIENT EDUCATION: Education details: see today's treatment, patient instructions, HEP for voice/dysarthria, compensations for voice/dysarthria Person educated: Patient Education method: Explanation, Demonstration, Verbal cues, and Handouts Education comprehension: returned demonstration, verbal cues required, and needs further education   HOME EXERCISE PROGRAM: SOVTE, flow  phonation, resonant voice therapy   GOALS: Goals reviewed with patient? Yes   SHORT TERM GOALS: Target date: 02/15/23   Pt will complete HEP for voice/dysarthria with rare min A over 2 sessions Baseline: Goal status: MET   2.  Pt will utilize flow phonation or resonant voice therapy to achieve clear phonation 18/20 phrases Baseline:  Goal status: PARTIALLY MET   3.  Pt will maintain volume and voice with out speaking on residual air over 18/20 10-12 word sentences Baseline:  Goal status: MET   4.  Pt will self correct dysphonia/dysarthria with occasional min A 18/20 sentences Baseline:  Goal status: PARTIALLY MET     LONG TERM GOALS: Target date: 04/12/23   Pt will achieve clear, forward focus phonation over 10 minute conversation with rare min A Baseline:  Goal status: ONGOING   2.  Pt will complete HEP with mod I Baseline:  Goal status: ONGOING   3.  Pt will be 90% intelligible over 10 minute conversation in noisy environment Baseline:  Goal status: ONGOING   4.  Pt will improve score on VHI by 3 points Baseline:  Goal status: ONGOING     ASSESSMENT:   CLINICAL IMPRESSION: Patient is a 47 y.o. female who was seen today for voice therapy. Continued instructing pt in resonant voice therapy exercises. Worked up from word level to discourse level. She is completing HEP and demonstrating improved vocal quality in sessions with occasoinal modeling and cues in structured tasks. She continues to required onging usual mod A to use appropriate breath support and forward phonation in conversation, as she reverts to pushing out speech and voice on residual air. I continue to recommend skilled ST to maximize her intelligibility for safety, QOL and to reduce family burden.    OBJECTIVE IMPAIRMENTS: include dysarthria and voice disorder. These impairments are limiting patient from effectively communicating at home and in community and safety when swallowing. Factors affecting  potential to achieve goals and functional outcome are previous level of function and time post onset .Marland Kitchen Patient will benefit from skilled SLP services to address above impairments and improve overall function.   REHAB POTENTIAL: Fair due to time post onset   PLAN:   SLP FREQUENCY: 2x/week   SLP DURATION: 12 weeks   PLANNED INTERVENTIONS: Aspiration precaution  training, Diet toleration management , Environmental controls, Trials of upgraded texture/liquids, Internal/external aids, Functional tasks, Multimodal communication approach, and SLP instruction and feedback          Kush Farabee, Radene Journey, CCC-SLP 03/15/2023, 11:24 AM

## 2023-03-15 NOTE — Patient Instructions (Addendum)
   Do the straw exercises twice daily - regularly in and out of water  May Me My Lourena Simmonds 15x - notice your voice and ease of speaking  Read aloud sentences focusing on forward voice out of your mouth  Great job managing your breath support - this is so much better - keep being mindful of breathing   This week be mindful of projecting your voice up and out - when you feel your voice being held in your throat  You are getting better - keep speaking with INTENT - which means being aware of how you are speaking  You are also doing a great job not straining your neck, shoulders and face to get volume!! Renee Rival - keep up the good work - you still need to be mindful of keeping your throat and neck relaxed when you are talking

## 2023-03-20 ENCOUNTER — Ambulatory Visit: Payer: BC Managed Care – PPO | Admitting: Speech Pathology

## 2023-03-20 ENCOUNTER — Encounter: Payer: Self-pay | Admitting: Speech Pathology

## 2023-03-20 DIAGNOSIS — R498 Other voice and resonance disorders: Secondary | ICD-10-CM | POA: Diagnosis not present

## 2023-03-20 DIAGNOSIS — R471 Dysarthria and anarthria: Secondary | ICD-10-CM | POA: Diagnosis not present

## 2023-03-20 NOTE — Patient Instructions (Signed)
   Avoid throat clears - this can hurt your vocal folds and results in more tension in your throat  Reset your voice with the straw exercises and may me my mow moo  Use the may me my mow moo 10x before you do a lot of talking (dinner out, appointments, phone calls etc)  And use this throughout the day to re-set your voice  Straw exercises every am and after lunch - keep a straw in your car

## 2023-03-20 NOTE — Therapy (Signed)
OUTPATIENT SPEECH LANGUAGE PATHOLOGY TREATMENT NOTE   Patient Name: [name] MRN: [id number] DOB:[birth year], 47 y.o., female Today's Date: 03/20/2023  PCP: Cleatis Polka., MD REFERRING PROVIDER: Cleatis Polka., MD  END OF SESSION:   End of Session - 03/20/23 0936     Visit Number 12    Number of Visits 25    Date for SLP Re-Evaluation 04/12/23    SLP Start Time 0930    SLP Stop Time  1015    SLP Time Calculation (min) 45 min    Activity Tolerance Patient tolerated treatment well             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   has slight rt sided weakness   Past Surgical History:  Procedure Laterality Date   ACHILLES TENDON SURGERY Bilateral    BRAIN SURGERY     Stent placed and removed   CESAREAN SECTION     x3   ENDOMETRIAL ABLATION W/ NOVASURE  11/08/1999   OPEN REDUCTION INTERNAL FIXATION (ORIF) METACARPAL Right 03/14/2014   Procedure: OPEN REDUCTION INTERNAL FIXATION (ORIF) RIGHT FOURTH AND FIFTH METACARPAL;  Surgeon: Dominica Severin, MD;  Location: [facility];  Service: Orthopedics;  Laterality: Right;   STRABISMUS SURGERY     TOE SURGERY Right    TRACHEOSTOMY  11/07/1997   UPPER GASTROINTESTINAL ENDOSCOPY     Patient Active Problem List   Diagnosis Date Noted   Hoarseness 05/23/2022   Chronic cough 02/08/2022   Dysphagia 02/08/2022    ONSET DATE: 01/04/2023  REFERRING DIAG: R13.12 (ICD-10-CM) - Dysphagia, oropharyngeal phase  THERAPY DIAG:  Dysarthria and anarthria  Other voice and resonance disorders  Rationale for Evaluation and Treatment: Rehabilitation  SUBJECTIVE:   SUBJECTIVE STATEMENT: " It did good this weekend" re: voice  PAIN: Are you having pain? No  OBJECTIVE:  MBSS WNL,  stroboscopy - WNL  TODAY'S TREATMENT:                                                                                                                                         DATE:    03/19/22: [name] has not completed straw exercises - her friends noted "I can understand you better" and that she is "more relaxed when talking." SOVTE  completed for 3 minutes then resonant voice. In conversation, [name] required occasional min cues for breath support and to correct strained talking on residual air. She benefits from resonant exercises to reset her voice during conversation. Instructed [name] to get more consistent completing SOVTE twice daily and using resonant voice exercises before longer speaking tasks (dinner/lunch with friends, phone  calls, appointments etc). In conversation [name] achieved intelligible clear phonation 70% of utterances.  5/8/24[name] notes  in her voice/speech- She has focused on achieving adequate breath support, not speaking on residual air as well as not straining her shoulders, neck and face when speaking. She has not consistently completed semi-occluded vocal tract exercises (SOVTE) SOVTE completed today in water with rare min A for volume. Resonant voice exercises with mod I - clear phonation. In oral reading task focusing on forward phonation, flow and speaking with intent, [name] achieved WNL volume (she feels too loud with WNL volume) and clear phonation 15/17 sentences. In conversation, she demonstrated adequate breath support 85% of utterances, WNL volume and clear phonation 70%. [name] continues to require cues to ID and correct hoarse voice.   5/1/24[name] observed that others do breathe quite frequently when speaking. She reports she has mindful of her breath support during speech. Today we focused on speaking with no tension in her neck, throat, shoulders, and face using easy speech rather than her forced tense speech. In sentences she required occasional min A to use more frequent  breathing and relaxed body. In conversation, [name] did demonstrate adequate breath support, catching her self when her voice broke or she felt strain and correcting this with occasional min A. Voice did remain mildly horse, however strain, aphonia and voice breaks did not occur. She is to practice this easy speech with frequent breathing in conversations at home.   03/06/23: Pt enters with horse voice - reset speech/voice with SOVTE and resonant voice exercies - using resonant m prior to reading 7 word sentences, she achieves clear phonation. Recorded her reading with cues to focus on breath support and flow sounds vs conversation. With min cues, [name] Id'd her strong reading voice vs her strained speech. She continues to require cues to use adequate breath support during conversation. She stated she feels self conscious stopping to breathe in conversation. This week she is to observe how frequently others breath during conversation. She is to use intent and be mindful of how she is using her voice during conversation. In conversation today, [name] Id'd strained speech and self corrected with usual mod A.   02/22/23: She enters with hoarse and strained voice. She completed resonant voice exercises with rare min A achieving clear phonation. Focused on feeling forward  focused phonation in resonant phrases and sentences 12/12x. Added flow phonation with flow syllables, phrases and she achieved clear phonation - when advanced to flow sentences [name] required occasional mod A to correct hoarse voice. In 5-7 word sentence reading focusing on flow and forward phonation to correct hoarse voice. She achieved clear phonation 7/10 sentences. In 8-9 word sentences initiating with /m/ for resonant voice to achieve clear phonation 6/10 sentences. In conversation, she reverts back to strain voice talking in residual air. Emphasized need to practice and correct this at home  02/20/23: Pt entered with "throaty voice" characterized by  hoarse and strained vocal quality. Pt reported she has been inconsistent with completing her exercises, although she tries to in the car. SLP facilitated discussion on how pt can remember to complete HEP. Dicussed importance of being consistent in order to generalize to conversation.  Given model and frequent min-A, pt accurately completed warm ups and SOVT exercises. The practiced resonant voice therapy at word to sentence level, requiring frequent min-A to correct. Pt independently self-corrected about 50% of the time. During conversation, pt required usual cues to self-correct strained vocal quality and implement strategies  and appropriate breath support.  Pt benefits from breaking up utterances into short phrases to achieve sufficient breath support for vocal clarity. Pt requires frequent visual cues to take a deep breath when her voice becomes hoarse. When told "Imagine that the last word in a sentence is written in all caps/bold/emphasized", pt's voice did not fade at the end as it usually does.   4/10/24[name] enters with hoarse, strained voice. Semi occluded vocal tract exercises with straw and resonant voice exercises with tongue in and out - she achieves clear, easy phonation with "m" before phrases. In structured task generating 3 sentence descriptions using resonant voice she achieved clear phonation 15/20 sentences with occasional min A - verbal cues and modeling. In conversation [name] required frequent mod A to ID and self correct talking on residual air with strain and hoarseness. She is quite talkative and falls back into strained voice and talking on residual air frequently  02/13/23: Pt with clear voice in waiting room, but worsened vocal quality (breathy, rough, strained) after entering therapy room. Given frequent model from SLP, pt completed resonant voice exercises as warm ups. Pt required frequent model and min-A to correct poor vocal quality when reading sentences, demonstrated clear vocal  quality in 65% of opportunities. In discourse, pt again required frequent min-A to maintain clear voice.  When pt's voice reduced in quality, pt reached 67 dB volume, although reached 70 dB in conversation when she used clear vocal quality.       PATIENT EDUCATION: Education details: see today's treatment, patient instructions, HEP for voice/dysarthria, compensations for voice/dysarthria Person educated: Patient Education method: Explanation, Demonstration, Verbal cues, and Handouts Education comprehension: returned demonstration, verbal cues required, and needs further education   HOME EXERCISE PROGRAM: SOVTE, flow phonation, resonant voice therapy   GOALS: Goals reviewed with patient? Yes   SHORT TERM GOALS: Target date: 02/15/23   Pt will complete HEP for voice/dysarthria with rare min A over 2 sessions Baseline: Goal status: MET   2.  Pt will utilize flow phonation or resonant voice therapy to achieve clear phonation 18/20 phrases Baseline:  Goal status: PARTIALLY MET   3.  Pt will maintain volume and voice with out speaking on residual air over 18/20 10-12 word sentences Baseline:  Goal status: MET   4.  Pt will self correct dysphonia/dysarthria with occasional min A 18/20 sentences Baseline:  Goal status: PARTIALLY MET     LONG TERM GOALS: Target date: 04/12/23   Pt will achieve clear, forward focus phonation over 10 minute conversation with rare min A Baseline:  Goal status: ONGOING   2.  Pt will complete HEP with mod I Baseline:  Goal status: ONGOING   3.  Pt will be 90% intelligible over 10 minute conversation in noisy environment Baseline:  Goal status: ONGOING   4.  Pt will improve score on VHI by 3 points Baseline:  Goal status: ONGOING     ASSESSMENT:   CLINICAL IMPRESSION: Patient is a 47 y.o. female who was seen today for voice therapy. Continued instructing pt in resonant voice therapy exercises. Worked up from word level to discourse level. She  is completing HEP and demonstrating improved vocal quality in sessions with occasoinal modeling and cues in structured tasks. She continues to required onging usual mod A to use appropriate breath support and forward phonation in conversation, as she reverts to pushing out speech and voice on residual air. I continue to recommend skilled ST to maximize her intelligibility for safety, QOL  and to reduce family burden.    OBJECTIVE IMPAIRMENTS: include dysarthria and voice disorder. These impairments are limiting patient from effectively communicating at home and in community and safety when swallowing. Factors affecting potential to achieve goals and functional outcome are previous level of function and time post onset .Marland Kitchen Patient will benefit from skilled SLP services to address above impairments and improve overall function.   REHAB POTENTIAL: Fair due to time post onset   PLAN:   SLP FREQUENCY: 2x/week   SLP DURATION: 12 weeks   PLANNED INTERVENTIONS: Aspiration precaution training, Diet toleration management , Environmental controls, Trials of upgraded texture/liquids, Internal/external aids, Functional tasks, Multimodal communication approach, and SLP instruction and feedback          Lonzell Dorris, Radene Journey, CCC-SLP 03/20/2023, 12:10 PM

## 2023-03-22 ENCOUNTER — Encounter: Payer: BC Managed Care – PPO | Admitting: Speech Pathology

## 2023-03-22 ENCOUNTER — Ambulatory Visit: Payer: BC Managed Care – PPO | Admitting: Speech Pathology

## 2023-03-22 DIAGNOSIS — R498 Other voice and resonance disorders: Secondary | ICD-10-CM | POA: Diagnosis not present

## 2023-03-22 DIAGNOSIS — R471 Dysarthria and anarthria: Secondary | ICD-10-CM | POA: Diagnosis not present

## 2023-03-22 NOTE — Therapy (Signed)
OUTPATIENT SPEECH LANGUAGE PATHOLOGY TREATMENT NOTE   Patient Name: [name] MRN: [id number] DOB:04-Jun-1976, 47 y.o., female Today's Date: 03/22/2023  PCP: Cleatis Polka., MD REFERRING PROVIDER: Cleatis Polka., MD  END OF SESSION:   End of Session - 03/22/23 1335     Visit Number 13    Number of Visits 25    Date for SLP Re-Evaluation 04/12/23    SLP Start Time 1330   arrived late   SLP Stop Time  1405    SLP Time Calculation (min) 35 min    Activity Tolerance Patient tolerated treatment well             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   has slight rt sided weakness   Past Surgical History:  Procedure Laterality Date   ACHILLES TENDON SURGERY Bilateral    BRAIN SURGERY     Stent placed and removed   CESAREAN SECTION     x3   ENDOMETRIAL ABLATION W/ NOVASURE  11/08/1999   OPEN REDUCTION INTERNAL FIXATION (ORIF) METACARPAL Right 03/14/2014   Procedure: OPEN REDUCTION INTERNAL FIXATION (ORIF) RIGHT FOURTH AND FIFTH METACARPAL;  Surgeon: Dominica Severin, MD;  Location: [facility];  Service: Orthopedics;  Laterality: Right;   STRABISMUS SURGERY     TOE SURGERY Right    TRACHEOSTOMY  11/07/1997   UPPER GASTROINTESTINAL ENDOSCOPY     Patient Active Problem List   Diagnosis Date Noted   Hoarseness 05/23/2022   Chronic cough 02/08/2022   Dysphagia 02/08/2022    ONSET DATE: 01/04/2023  REFERRING DIAG: R13.12 (ICD-10-CM) - Dysphagia, oropharyngeal phase  THERAPY DIAG:  Dysarthria and anarthria  Other voice and resonance disorders  Rationale for Evaluation and Treatment: Rehabilitation  SUBJECTIVE:   SUBJECTIVE STATEMENT: " I feel a little hoarse" re: voice  PAIN: Are you having pain? No  OBJECTIVE:   MBSS WNL, stroboscopy - WNL  TODAY'S TREATMENT:                                                                                                                                         DATE:   03/22/23: Resonant voice exercises to warm up and clear phonation completed with mod I. She is demonstrating adequate breath support and required 2 cues to reduce facial and neck strain in conversation. Gerelene observed that her voice gets better in speech therapy - instructed her that it's better because she is focusing on using her strategies in ST and that she needs to be mindful of how she is speaking across settings. In conversation phonation clears with volume, intent and use of resonant  exercises to re-calibrate voice.   03/19/22: Audra has not completed straw exercises - her friends noted "I can understand you better" and that she is "more relaxed when talking." SOVTE  completed for 3 minutes then resonant voice. In conversation, Tracye required occasional min cues for breath support and to correct strained talking on residual air. She benefits from resonant exercises to reset her voice during conversation. Instructed Peris to get more consistent completing SOVTE twice daily and using resonant voice exercises before longer speaking tasks (dinner/lunch with friends, phone calls, appointments etc). In conversation Rashawnda achieved intelligible clear phonation 70% of utterances.  5/8/24[name] notes  in her voice/speech- She has focused on achieving adequate breath support, not speaking on residual air as well as not straining her shoulders, neck and face when speaking. She has not consistently completed semi-occluded vocal tract exercises (SOVTE) SOVTE completed today in water with rare min A for volume. Resonant voice exercises with mod I - clear phonation. In oral reading task focusing on forward phonation, flow and speaking with intent, Breezie achieved WNL volume (she feels too loud with WNL volume) and clear phonation 15/17  sentences. In conversation, she demonstrated adequate breath support 85% of utterances, WNL volume and clear phonation 70%. Alaire continues to require cues to ID and correct hoarse voice.   5/1/24[name] observed that others do breathe quite frequently when speaking. She reports she has mindful of her breath support during speech. Today we focused on speaking with no tension in her neck, throat, shoulders, and face using easy speech rather than her forced tense speech. In sentences she required occasional min A to use more frequent breathing and relaxed body. In conversation, Marlet did demonstrate adequate breath support, catching her self when her voice broke or she felt strain and correcting this with occasional min A. Voice did remain mildly horse, however strain, aphonia and voice breaks did not occur. She is to practice this easy speech with frequent breathing in conversations at home.   03/06/23: Pt enters with horse voice - reset speech/voice with SOVTE and resonant voice exercies - using resonant m prior to reading 7 word sentences, she achieves clear phonation. Recorded her reading with cues to focus on breath support and flow sounds vs conversation. With min cues, Allanah Id'd her strong reading voice vs her strained speech. She continues to require cues to use adequate breath support during conversation. She stated she feels self conscious stopping to breathe in conversation. This week she is to observe how frequently others breath during conversation. She is to use intent and be mindful of how she is using her voice during conversation. In conversation today, Chloie Id'd strained speech and self corrected with usual mod A.   02/22/23: She enters with hoarse and strained voice. She completed resonant voice exercises with rare min A achieving clear phonation. Focused on feeling forward  focused phonation in resonant phrases and sentences 12/12x. Added flow phonation with flow syllables, phrases and she  achieved clear phonation - when advanced to flow sentences Cerria required occasional mod A to correct hoarse voice. In 5-7 word sentence reading focusing on flow and forward phonation to correct hoarse voice. She achieved clear phonation 7/10 sentences. In 8-9 word sentences initiating with /m/ for resonant voice to achieve clear phonation 6/10 sentences. In conversation, she reverts back to strain voice talking in residual air. Emphasized need to practice and correct this at home  02/20/23: Pt entered with "throaty voice" characterized by hoarse and strained vocal  quality. Pt reported she has been inconsistent with completing her exercises, although she tries to in the car. SLP facilitated discussion on how pt can remember to complete HEP. Dicussed importance of being consistent in order to generalize to conversation.  Given model and frequent min-A, pt accurately completed warm ups and SOVT exercises. The practiced resonant voice therapy at word to sentence level, requiring frequent min-A to correct. Pt independently self-corrected about 50% of the time. During conversation, pt required usual cues to self-correct strained vocal quality and implement strategies and appropriate breath support.  Pt benefits from breaking up utterances into short phrases to achieve sufficient breath support for vocal clarity. Pt requires frequent visual cues to take a deep breath when her voice becomes hoarse. When told "Imagine that the last word in a sentence is written in all caps/bold/emphasized", pt's voice did not fade at the end as it usually does.   4/10/24[name] enters with hoarse, strained voice. Semi occluded vocal tract exercises with straw and resonant voice exercises with tongue in and out - she achieves clear, easy phonation with "m" before phrases. In structured task generating 3 sentence descriptions using resonant voice she achieved clear phonation 15/20 sentences with occasional min A - verbal cues and modeling.  In conversation Yosha required frequent mod A to ID and self correct talking on residual air with strain and hoarseness. She is quite talkative and falls back into strained voice and talking on residual air frequently  02/13/23: Pt with clear voice in waiting room, but worsened vocal quality (breathy, rough, strained) after entering therapy room. Given frequent model from SLP, pt completed resonant voice exercises as warm ups. Pt required frequent model and min-A to correct poor vocal quality when reading sentences, demonstrated clear vocal quality in 65% of opportunities. In discourse, pt again required frequent min-A to maintain clear voice.  When pt's voice reduced in quality, pt reached 67 dB volume, although reached 70 dB in conversation when she used clear vocal quality.       PATIENT EDUCATION: Education details: see today's treatment, patient instructions, HEP for voice/dysarthria, compensations for voice/dysarthria Person educated: Patient Education method: Explanation, Demonstration, Verbal cues, and Handouts Education comprehension: returned demonstration, verbal cues required, and needs further education   HOME EXERCISE PROGRAM: SOVTE, flow phonation, resonant voice therapy   GOALS: Goals reviewed with patient? Yes   SHORT TERM GOALS: Target date: 02/15/23   Pt will complete HEP for voice/dysarthria with rare min A over 2 sessions Baseline: Goal status: MET   2.  Pt will utilize flow phonation or resonant voice therapy to achieve clear phonation 18/20 phrases Baseline:  Goal status: PARTIALLY MET   3.  Pt will maintain volume and voice with out speaking on residual air over 18/20 10-12 word sentences Baseline:  Goal status: MET   4.  Pt will self correct dysphonia/dysarthria with occasional min A 18/20 sentences Baseline:  Goal status: PARTIALLY MET     LONG TERM GOALS: Target date: 04/12/23   Pt will achieve clear, forward focus phonation over 10 minute conversation  with rare min A Baseline:  Goal status: ONGOING   2.  Pt will complete HEP with mod I Baseline:  Goal status: ONGOING   3.  Pt will be 90% intelligible over 10 minute conversation in noisy environment Baseline:  Goal status: ONGOING   4.  Pt will improve score on VHI by 3 points Baseline:  Goal status: ONGOING     ASSESSMENT:   CLINICAL IMPRESSION:  Patient is a 47 y.o. female who was seen today for voice therapy. Continued instructing pt in resonant voice therapy exercises. Worked up from word level to discourse level. She is completing HEP and demonstrating improved vocal quality in sessions with occasoinal modeling and cues in structured tasks. She continues to required onging usual mod A to use appropriate breath support and forward phonation in conversation, as she reverts to pushing out speech and voice on residual air. I continue to recommend skilled ST to maximize her intelligibility for safety, QOL and to reduce family burden.    OBJECTIVE IMPAIRMENTS: include dysarthria and voice disorder. These impairments are limiting patient from effectively communicating at home and in community and safety when swallowing. Factors affecting potential to achieve goals and functional outcome are previous level of function and time post onset .Marland Kitchen Patient will benefit from skilled SLP services to address above impairments and improve overall function.   REHAB POTENTIAL: Fair due to time post onset   PLAN:   SLP FREQUENCY: 2x/week   SLP DURATION: 12 weeks   PLANNED INTERVENTIONS: Aspiration precaution training, Diet toleration management , Environmental controls, Trials of upgraded texture/liquids, Internal/external aids, Functional tasks, Multimodal communication approach, and SLP instruction and feedback          Yahmir Sokolov, Radene Journey, CCC-SLP 03/22/2023, 2:12 PM

## 2023-03-29 ENCOUNTER — Encounter: Payer: Self-pay | Admitting: Speech Pathology

## 2023-03-29 ENCOUNTER — Ambulatory Visit: Payer: BC Managed Care – PPO | Admitting: Speech Pathology

## 2023-03-29 DIAGNOSIS — R471 Dysarthria and anarthria: Secondary | ICD-10-CM | POA: Diagnosis not present

## 2023-03-29 DIAGNOSIS — R498 Other voice and resonance disorders: Secondary | ICD-10-CM | POA: Diagnosis not present

## 2023-03-29 NOTE — Patient Instructions (Signed)
  Continue daily straw exercises twice a day and m exercises and hums  With [employer] - make them prolonged and connected  [employer]  [facility] prolonged  Make more money  Maybe tomorrow  My mother makes me muffins  Great job breathing more frequently and keeping tension out of your face, throat, neck and shoulders  Remember when you need to project, take a big breath - don't strain from your throat  Great improvement on the Voice survey - 95 down to 68  Remember, your natural voice (pre-accident) was loud - when you project your voice sounds good!! Own your voice!!

## 2023-03-29 NOTE — Therapy (Signed)
OUTPATIENT SPEECH LANGUAGE PATHOLOGY TREATMENT NOTE& DISCHARGE SUMMARY   Patient Name: [name] MRN: [id number] DOB:1976/01/16, 47 y.o., female Today's Date: 03/29/2023  PCP: Cleatis Polka., MD REFERRING PROVIDER: Cleatis Polka., MD  END OF SESSION:   End of Session - 03/29/23 0852     Visit Number 14    Number of Visits 25    Date for SLP Re-Evaluation 04/12/23    SLP Start Time 0852   arriived late   SLP Stop Time  0930    SLP Time Calculation (min) 38 min    Activity Tolerance Patient tolerated treatment well             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   has slight rt sided weakness   Past Surgical History:  Procedure Laterality Date   ACHILLES TENDON SURGERY Bilateral    BRAIN SURGERY     Stent placed and removed   CESAREAN SECTION     x3   ENDOMETRIAL ABLATION W/ NOVASURE  11/08/1999   OPEN REDUCTION INTERNAL FIXATION (ORIF) METACARPAL Right 03/14/2014   Procedure: OPEN REDUCTION INTERNAL FIXATION (ORIF) RIGHT FOURTH AND FIFTH METACARPAL;  Surgeon: Dominica Severin, MD;  Location: [facility];  Service: Orthopedics;  Laterality: Right;   STRABISMUS SURGERY     TOE SURGERY Right    TRACHEOSTOMY  11/07/1997   UPPER GASTROINTESTINAL ENDOSCOPY     Patient Active Problem List   Diagnosis Date Noted   Hoarseness 05/23/2022   Chronic cough 02/08/2022   Dysphagia 02/08/2022    ONSET DATE: 01/04/2023  REFERRING DIAG: R13.12 (ICD-10-CM) - Dysphagia, oropharyngeal phase  THERAPY DIAG:  Dysarthria and anarthria  Other voice and resonance disorders  Rationale for Evaluation and Treatment: Rehabilitation  SUBJECTIVE:   SUBJECTIVE STATEMENT: " It's been good" re: voice  PAIN: Are you having pain?  No  OBJECTIVE:  MBSS WNL, stroboscopy - WNL  TODAY'S TREATMENT:                                                                                                                                         DATE:   03/29/23: [name] reports overall improved voice, speech and communication. She is completing semi-occluded vocal tract exercises 1x a day and resonant voice exercises in the car several times a day and before speaking. Today, [name] required verbal cues and modeling to extend may me my mow moo and connect them rather than making short staccato syllables. She continues to report friends have noticed improved voice and she endorses reduced requests for repetition. She is no longer straining or talking on residual air. In  conversation over 15 minutes after warm up, [name] achieved clear phonation 50% of utterances. Interesting, she did achieve clear phonation over 12 minute walk outside around the building, possibly due to increased volume from environmental noise. Continue to encourage her to use strong volume, as she notes she had a loud voice prior to her accident, to improve voice quality. D/C ST, [name] is in agreement. [name] improve score on Voice Handicap Index from 95 to 68 (lower score is improvement)   03/22/23: Resonant voice exercises to warm up and clear phonation completed with mod I. She is demonstrating adequate breath support and required 2 cues to reduce facial and neck strain in conversation. [name] observed that her voice gets better in speech therapy - instructed her that it's better because she is focusing on using her strategies in ST and that she needs to be mindful of how she is speaking across settings. In conversation phonation clears with volume, intent and use of resonant exercises to re-calibrate voice.   03/19/22: [name] has not completed straw exercises - her friends noted "I can understand you better" and that she is "more relaxed when talking." SOVTE  completed for 3 minutes then  resonant voice. In conversation, [name] required occasional min cues for breath support and to correct strained talking on residual air. She benefits from resonant exercises to reset her voice during conversation. Instructed [name] to get more consistent completing SOVTE twice daily and using resonant voice exercises before longer speaking tasks (dinner/lunch with friends, phone calls, appointments etc). In conversation [name] achieved intelligible clear phonation 70% of utterances.  5/8/24[name] notes  in her voice/speech- She has focused on achieving adequate breath support, not speaking on residual air as well as not straining her shoulders, neck and face when speaking. She has not consistently completed semi-occluded vocal tract exercises (SOVTE) SOVTE completed today in water with rare min A for volume. Resonant voice exercises with mod I - clear phonation. In oral reading task focusing on forward phonation, flow and speaking with intent, [name] achieved WNL volume (she feels too loud with WNL volume) and clear phonation 15/17 sentences. In conversation, she demonstrated adequate breath support 85% of utterances, WNL volume and clear phonation 70%. [name] continues to require cues to ID and correct hoarse voice.   5/1/24[name] observed that others do breathe quite frequently when speaking. She reports she has mindful of her breath support during speech. Today we focused on speaking with no tension in her neck, throat, shoulders, and face using easy speech rather than her forced tense speech. In sentences she required occasional min A to use more frequent breathing and relaxed body. In conversation, [name] did demonstrate adequate breath support, catching her self when her voice broke or she felt strain and correcting this with occasional min A. Voice did remain mildly horse, however strain, aphonia and voice breaks did not occur. She is to practice this easy speech with frequent breathing in conversations at home.    03/06/23: Pt enters with horse voice - reset speech/voice with SOVTE and resonant voice exercies - using resonant m prior to reading 7 word sentences, she achieves clear phonation. Recorded her reading with cues to focus on breath support and flow sounds vs conversation. With min cues, Mays Id'd her strong reading voice vs her strained speech. She continues to require cues to use adequate breath support during conversation. She stated she feels self conscious stopping to breathe in conversation. This week she is to observe how frequently others breath  during conversation. She is to use intent and be mindful of how she is using her voice during conversation. In conversation today, Shanae Id'd strained speech and self corrected with usual mod A.   02/22/23: She enters with hoarse and strained voice. She completed resonant voice exercises with rare min A achieving clear phonation. Focused on feeling forward  focused phonation in resonant phrases and sentences 12/12x. Added flow phonation with flow syllables, phrases and she achieved clear phonation - when advanced to flow sentences Sherese required occasional mod A to correct hoarse voice. In 5-7 word sentence reading focusing on flow and forward phonation to correct hoarse voice. She achieved clear phonation 7/10 sentences. In 8-9 word sentences initiating with /m/ for resonant voice to achieve clear phonation 6/10 sentences. In conversation, she reverts back to strain voice talking in residual air. Emphasized need to practice and correct this at home  02/20/23: Pt entered with "throaty voice" characterized by hoarse and strained vocal quality. Pt reported she has been inconsistent with completing her exercises, although she tries to in the car. SLP facilitated discussion on how pt can remember to complete HEP. Dicussed importance of being consistent in order to generalize to conversation.  Given model and frequent min-A, pt accurately completed warm ups and SOVT  exercises. The practiced resonant voice therapy at word to sentence level, requiring frequent min-A to correct. Pt independently self-corrected about 50% of the time. During conversation, pt required usual cues to self-correct strained vocal quality and implement strategies and appropriate breath support.  Pt benefits from breaking up utterances into short phrases to achieve sufficient breath support for vocal clarity. Pt requires frequent visual cues to take a deep breath when her voice becomes hoarse. When told "Imagine that the last word in a sentence is written in all caps/bold/emphasized", pt's voice did not fade at the end as it usually does.       PATIENT EDUCATION: Education details: see today's treatment, patient instructions, HEP for voice/dysarthria, compensations for voice/dysarthria Person educated: Patient Education method: Explanation, Demonstration, Verbal cues, and Handouts Education comprehension: returned demonstration, verbal cues required, and needs further education   HOME EXERCISE PROGRAM: SOVTE, flow phonation, resonant voice therapy     SPEECH THERAPY DISCHARGE SUMMARY  Visits from Start of Care: 14  Current functional level related to goals / functional outcomes: See goals below   Remaining deficits: Intermittent dysphonia   Education / Equipment: HEP for dysarthria/dysphonia; compensations for dysarthria/dysphonia   Patient agrees to discharge. Patient goals were met. Patient is being discharged due to meeting the stated rehab goals.Marland Kitchen    GOALS: Goals reviewed with patient? Yes   SHORT TERM GOALS: Target date: 02/15/23   Pt will complete HEP for voice/dysarthria with rare min A over 2 sessions Baseline: Goal status: MET   2.  Pt will utilize flow phonation or resonant voice therapy to achieve clear phonation 18/20 phrases Baseline:  Goal status: PARTIALLY MET   3.  Pt will maintain volume and voice with out speaking on residual air over 18/20  10-12 word sentences Baseline:  Goal status: MET   4.  Pt will self correct dysphonia/dysarthria with occasional min A 18/20 sentences Baseline:  Goal status: PARTIALLY MET     LONG TERM GOALS: Target date: 04/12/23   Pt will achieve clear, forward focus phonation over 10 minute conversation with rare min A Baseline:  Goal status: MET   2.  Pt will complete HEP with mod I Baseline:  Goal status: MET  3.  Pt will be 90% intelligible over 10 minute conversation in noisy environment Baseline:  Goal status: MET   4.  Pt will improve score on VHI (Voice Handicap Index)by 3 points Baseline:  Goal status: MET     ASSESSMENT:   CLINICAL IMPRESSION: Patient is a 47 y.o. female who was seen today for voice therapy. Continued instructing pt in resonant voice therapy exercises. Pranati is mod I in HEP for voice including SOVTE and resonant voice exercises. She reports she no longer feels like her "tongue is thick" and is speaking with intent to articulate accurately. She achieves clear phonation 50-60% of utterances and has eliminated vocal, shoulder, neck and facial strain in conversation and has eliminated speaking on residual air. She is aware that she will need to continue HEP and be mindful and intentional of carrying over strategies and breath support in conversations. At this time, goals met, d/c ST  OBJECTIVE IMPAIRMENTS: include dysarthria and voice disorder. These impairments are limiting patient from effectively communicating at home and in community and safety when swallowing. Factors affecting potential to achieve goals and functional outcome are previous level of function and time post onset .Marland Kitchen Patient will benefit from skilled SLP services to address above impairments and improve overall function.   REHAB POTENTIAL: Fair due to time post onset   PLAN:   SLP FREQUENCY: 2x/week   SLP DURATION: 12 weeks   PLANNED INTERVENTIONS: Aspiration precaution training, Diet toleration  management , Environmental controls, Trials of upgraded texture/liquids, Internal/external aids, Functional tasks, Multimodal communication approach, and SLP instruction and feedback          Dodie Parisi, Radene Journey, CCC-SLP 03/29/2023, 11:57 AM

## 2023-03-31 ENCOUNTER — Encounter: Payer: Self-pay | Admitting: Physical Medicine & Rehabilitation

## 2023-03-31 ENCOUNTER — Encounter: Payer: Self-pay | Admitting: Internal Medicine

## 2023-04-11 ENCOUNTER — Encounter
Payer: BC Managed Care – PPO | Attending: Physical Medicine & Rehabilitation | Admitting: Physical Medicine & Rehabilitation

## 2023-04-13 ENCOUNTER — Ambulatory Visit (AMBULATORY_SURGERY_CENTER): Payer: BC Managed Care – PPO | Admitting: Internal Medicine

## 2023-04-13 ENCOUNTER — Encounter: Payer: Self-pay | Admitting: Internal Medicine

## 2023-04-13 VITALS — BP 114/73 | HR 52 | Temp 98.7°F | Resp 17 | Ht 65.0 in | Wt 192.0 lb

## 2023-04-13 DIAGNOSIS — Z1211 Encounter for screening for malignant neoplasm of colon: Secondary | ICD-10-CM | POA: Diagnosis not present

## 2023-04-13 DIAGNOSIS — K200 Eosinophilic esophagitis: Secondary | ICD-10-CM

## 2023-04-13 DIAGNOSIS — D120 Benign neoplasm of cecum: Secondary | ICD-10-CM

## 2023-04-13 DIAGNOSIS — K2289 Other specified disease of esophagus: Secondary | ICD-10-CM | POA: Diagnosis not present

## 2023-04-13 MED ORDER — SODIUM CHLORIDE 0.9 % IV SOLN: 500.0000 mL | Freq: Once | INTRAVENOUS | Status: DC

## 2023-04-13 NOTE — Op Note (Signed)
[facility] Patient Name: [name] Procedure Date: 04/13/2023 2:10 PM MRN: [id number] Endoscopist: Madelyn Brunner Wyano , , [id number] Age: 47 Referring MD:  Date of Birth: 05-Apr-1976 Gender: Female Account #: [id number] Procedure:                Upper GI endoscopy Indications:              Dysphagia, Heartburn, Follow-up of eosinophilic                            esophagitis Medicines:                Monitored Anesthesia Care Procedure:                Pre-Anesthesia Assessment:                           - Prior to the procedure, a History and Physical                            was performed, and patient medications and                            allergies were reviewed. The patient's tolerance of                            previous anesthesia was also reviewed. The risks                            and benefits of the procedure and the sedation                            options and risks were discussed with the patient.                            All questions were answered, and informed consent                            was obtained. Prior Anticoagulants: The patient has                            taken no anticoagulant or antiplatelet agents. ASA                            Grade Assessment: II - A patient with mild systemic                            disease. After reviewing the risks and benefits,                            the patient was deemed in satisfactory condition to                            undergo the procedure.  After obtaining informed consent, the endoscope was                            passed under direct vision. Throughout the                            procedure, the patient's blood pressure, pulse, and                            oxygen saturations were monitored continuously. The                            Olympus Scope [id number] was introduced through the                            mouth, and advanced to the second part of  duodenum.                            The upper GI endoscopy was accomplished without                            difficulty. The patient tolerated the procedure                            well. Scope In: Scope Out: Findings:                 Mucosal changes were found in the entire esophagus.                            Esophageal findings were graded using the                            Eosinophilic Esophagitis Endoscopic Reference Score                            (EoE-EREFS) as: Edema Grade 1 Present (decreased                            clarity or absence of vascular markings), Rings                            Grade 1 Mild (subtle circumferential ridges seen on                            esophageal distension), Exudates Grade 1 Mild                            (scattered white lesions involving less than 10                            percent of the esophageal surface area), Furrows                            Grade 0  None (no vertical lines seen) and Stricture                            none (no stricture found). Biopsies were obtained                            from the proximal and distal esophagus with cold                            forceps for histology of suspected eosinophilic                            esophagitis.                           A scar from prior G-tube placement was found in the                            gastric body.                           The examined duodenum was normal. Complications:            No immediate complications. Estimated Blood Loss:     Estimated blood loss was minimal. Impression:               - Esophageal mucosal changes secondary to                            eosinophilic esophagitis.                           - Scar from prior G-tube placement in the gastric                            body.                           - Normal examined duodenum.                           - Biopsies were taken with a cold forceps for                             evaluation of eosinophilic esophagitis. Recommendation:           - Await pathology results.                           - Perform a colonoscopy today. Dr Particia Lather "[name]" Leonides Schanz,  04/13/2023 2:52:49 PM

## 2023-04-13 NOTE — Progress Notes (Signed)
Vitals-DT  Pt's states no medical or surgical changes since previsit or office visit.  

## 2023-04-13 NOTE — Progress Notes (Signed)
Uneventful anesthetic. Report to pacu rn. Vss. Care resumed by rn. 

## 2023-04-13 NOTE — Patient Instructions (Addendum)
Recommendation:- Discharge patient to home (with escort).                           - Await pathology results.                           - The findings and recommendations were discussed                            with the patient.                           - Return to GI clinic in 2 months for follow up of                            EoE.  Handouts on polyps and hemorrhoids given.   YOU HAD AN ENDOSCOPIC PROCEDURE TODAY AT THE [facility]:   Refer to the procedure report that was given to you for any specific questions about what was found during the examination.  If the procedure report does not answer your questions, please call your gastroenterologist to clarify.  If you requested that your care partner not be given the details of your procedure findings, then the procedure report has been included in a sealed envelope for you to review at your convenience later.  YOU SHOULD EXPECT: Some feelings of bloating in the abdomen. Passage of more gas than usual.  Walking can help get rid of the air that was put into your GI tract during the procedure and reduce the bloating. If you had a lower endoscopy (such as a colonoscopy or flexible sigmoidoscopy) you may notice spotting of blood in your stool or on the toilet paper. If you underwent a bowel prep for your procedure, you may not have a normal bowel movement for a few days.  Please Note:  You might notice some irritation and congestion in your nose or some drainage.  This is from the oxygen used during your procedure.  There is no need for concern and it should clear up in a day or so.  SYMPTOMS TO REPORT IMMEDIATELY:  Following lower endoscopy (colonoscopy or flexible sigmoidoscopy):  Excessive amounts of blood in the stool  Significant tenderness or worsening of abdominal pains  Swelling of the abdomen that is new, acute  Fever of 100F or higher  For urgent or emergent issues, a gastroenterologist can be reached at any hour by  calling [phone]. Do not use MyChart messaging for urgent concerns.    DIET:  We do recommend a small meal at first, but then you may proceed to your regular diet.  Drink plenty of fluids but you should avoid alcoholic beverages for 24 hours.  ACTIVITY:  You should plan to take it easy for the rest of today and you should NOT DRIVE or use heavy machinery until tomorrow (because of the sedation medicines used during the test).    FOLLOW UP: Our staff will call the number listed on your records the next business day following your procedure.  We will call around 7:15- 8:00 am to check on you and address any questions or concerns that you may have regarding the information given to you following your procedure. If we do not reach you, we will leave a message.  If any biopsies were taken you will be contacted by phone or by letter within the next 1-3 weeks.  Please call us at [phone] if you have not heard about the biopsies in 3 weeks.    SIGNATURES/CONFIDENTIALITY: You and/or your care partner have signed paperwork which will be entered into your electronic medical record.  These signatures attest to the fact that that the information above on your After Visit Summary has been reviewed and is understood.  Full responsibility of the confidentiality of this discharge information lies with you and/or your care-partner.
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
## 2023-04-13 NOTE — Progress Notes (Signed)
Called to room to assist during endoscopic procedure.  Patient ID and intended procedure confirmed with present staff. Received instructions for my participation in the procedure from the performing physician.  

## 2023-04-13 NOTE — Op Note (Signed)
[facility] Patient Name: [name] Procedure Date: 04/13/2023 2:01 PM MRN: [id number] Endoscopist: Madelyn Brunner [place] , , [id number] Age: 47 Referring MD:  Date of Birth: Jul 20, 1976 Gender: Female Account #: [id number] Procedure:                Colonoscopy Indications:              Screening for colorectal malignant neoplasm, This                            is the patient's first colonoscopy Medicines:                Monitored Anesthesia Care Procedure:                Pre-Anesthesia Assessment:                           - Prior to the procedure, a History and Physical                            was performed, and patient medications and                            allergies were reviewed. The patient's tolerance of                            previous anesthesia was also reviewed. The risks                            and benefits of the procedure and the sedation                            options and risks were discussed with the patient.                            All questions were answered, and informed consent                            was obtained. Prior Anticoagulants: The patient has                            taken no anticoagulant or antiplatelet agents. ASA                            Grade Assessment: II - A patient with mild systemic                            disease. After reviewing the risks and benefits,                            the patient was deemed in satisfactory condition to                            undergo the procedure.  After obtaining informed consent, the colonoscope                            was passed under direct vision. Throughout the                            procedure, the patient's blood pressure, pulse, and                            oxygen saturations were monitored continuously. The                            CF [id number] #[id number] was introduced through the anus                            and advanced to the  the terminal ileum. The                            colonoscopy was performed without difficulty. The                            patient tolerated the procedure well. The quality                            of the bowel preparation was excellent. The                            terminal ileum, ileocecal valve, appendiceal                            orifice, and rectum were photographed. Scope In: 2:23:31 PM Scope Out: 2:39:07 PM Scope Withdrawal Time: 0 hours 11 minutes 58 seconds  Total Procedure Duration: 0 hours 15 minutes 36 seconds  Findings:                 The terminal ileum appeared normal.                           A 3 mm polyp was found in the cecum. The polyp was                            sessile. The polyp was removed with a cold snare.                            Resection and retrieval were complete.                           Non-bleeding internal hemorrhoids were found during                            retroflexion. Complications:            No immediate complications. Estimated Blood Loss:     Estimated blood loss was minimal. Impression:               -  The examined portion of the ileum was normal.                           - One 3 mm polyp in the cecum, removed with a cold                            snare. Resected and retrieved.                           - Non-bleeding internal hemorrhoids. Recommendation:           - Discharge patient to home (with escort).                           - Await pathology results.                           - The findings and recommendations were discussed                            with the patient.                           - Return to GI clinic in 2 months for follow up of                            EoE. Dr Particia Lather "Eulah Pont,  04/13/2023 2:54:25 PM

## 2023-04-14 ENCOUNTER — Telehealth: Payer: Self-pay

## 2023-04-14 NOTE — Telephone Encounter (Signed)
  Follow up Call-     04/13/2023    1:36 PM 04/13/2023    1:25 PM 09/13/2022    9:34 AM 06/09/2022   10:04 AM  Call back number  Post procedure Call Back phone  # [phone]  [phone]  Permission to leave phone message Yes Yes Yes Yes  comments    no voicemail set up     Patient questions:  Do you have a fever, pain , or abdominal swelling? No. Pain Score  0 *  Have you tolerated food without any problems? Yes.    Have you been able to return to your normal activities? Yes.    Do you have any questions about your discharge instructions: Diet   No. Medications  No. Follow up visit  No.  Do you have questions or concerns about your Care? No.  Actions: * If pain score is 4 or above: No action needed, pain <4.

## 2023-04-18 ENCOUNTER — Encounter: Payer: Self-pay | Admitting: Internal Medicine

## 2023-05-09 ENCOUNTER — Encounter: Payer: BC Managed Care – PPO | Admitting: Physical Medicine & Rehabilitation

## 2023-06-05 ENCOUNTER — Encounter (INDEPENDENT_AMBULATORY_CARE_PROVIDER_SITE_OTHER): Payer: Self-pay

## 2023-06-07 DIAGNOSIS — Z1231 Encounter for screening mammogram for malignant neoplasm of breast: Secondary | ICD-10-CM | POA: Diagnosis not present

## 2023-06-19 DIAGNOSIS — Z6834 Body mass index (BMI) 34.0-34.9, adult: Secondary | ICD-10-CM | POA: Diagnosis not present

## 2023-06-19 DIAGNOSIS — Z124 Encounter for screening for malignant neoplasm of cervix: Secondary | ICD-10-CM | POA: Diagnosis not present

## 2023-06-19 DIAGNOSIS — Z01419 Encounter for gynecological examination (general) (routine) without abnormal findings: Secondary | ICD-10-CM | POA: Diagnosis not present

## 2023-06-20 ENCOUNTER — Encounter: Payer: Self-pay | Admitting: Physical Medicine & Rehabilitation

## 2023-06-20 ENCOUNTER — Encounter
Payer: BC Managed Care – PPO | Attending: Physical Medicine & Rehabilitation | Admitting: Physical Medicine & Rehabilitation

## 2023-06-20 VITALS — BP 121/79 | HR 65 | Ht 65.0 in | Wt 195.4 lb

## 2023-06-20 DIAGNOSIS — G8110 Spastic hemiplegia affecting unspecified side: Secondary | ICD-10-CM | POA: Diagnosis not present

## 2023-06-20 DIAGNOSIS — Z87820 Personal history of traumatic brain injury: Secondary | ICD-10-CM | POA: Diagnosis not present
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
## 2023-07-28 ENCOUNTER — Ambulatory Visit: Payer: BC Managed Care – PPO | Admitting: Internal Medicine

## 2023-07-28 ENCOUNTER — Encounter: Payer: Self-pay | Admitting: Physical Medicine & Rehabilitation

## 2023-07-28 ENCOUNTER — Encounter
Payer: BC Managed Care – PPO | Attending: Physical Medicine & Rehabilitation | Admitting: Physical Medicine & Rehabilitation

## 2023-07-28 VITALS — BP 126/84 | HR 61 | Ht 65.0 in | Wt 196.0 lb

## 2023-07-28 DIAGNOSIS — G8110 Spastic hemiplegia affecting unspecified side: Secondary | ICD-10-CM

## 2023-07-28 DIAGNOSIS — G8111 Spastic hemiplegia affecting right dominant side: Secondary | ICD-10-CM

## 2023-07-28 MED ORDER — SODIUM CHLORIDE (PF) 0.9 % IJ SOLN
2.0000 mL | Freq: Once | INTRAMUSCULAR | Status: AC
Start: 2023-07-28 — End: ?

## 2023-07-28 MED ORDER — INCOBOTULINUMTOXINA 100 UNITS IM SOLR
100.0000 [IU] | Freq: Once | INTRAMUSCULAR | Status: AC
Start: 2023-07-28 — End: ?

## 2023-07-28 NOTE — Progress Notes (Signed)
Botox Injection for spasticity using needle EMG guidance  Dilution: 50 Units/ml Indication: Severe spasticity which interferes with ADL,mobility and/or  hygiene and is unresponsive to medication management and other conservative care Informed consent was obtained after describing risks and benefits of the procedure with the patient. This includes bleeding, bruising, infection, excessive weakness, or medication side effects. A REMS form is on file and signed. Needle: 27g 1" for upper ext and 25g 2" for lower ext  needle electrode Number of units per muscle RIght FPL 25U Right Tib post 75U All injections were done after obtaining appropriate EMG activity and after negative drawback for blood. The patient tolerated the procedure well. Post procedure instructions were given. A followup appointment was made.

## 2023-08-03 DIAGNOSIS — L820 Inflamed seborrheic keratosis: Secondary | ICD-10-CM | POA: Diagnosis not present

## 2023-09-08 ENCOUNTER — Encounter: Payer: Self-pay | Admitting: Physical Medicine & Rehabilitation

## 2023-09-08 ENCOUNTER — Encounter
Payer: BC Managed Care – PPO | Attending: Physical Medicine & Rehabilitation | Admitting: Physical Medicine & Rehabilitation

## 2023-09-08 VITALS — BP 114/77 | HR 64 | Ht 65.0 in | Wt 195.0 lb

## 2023-09-08 DIAGNOSIS — G8111 Spastic hemiplegia affecting right dominant side: Secondary | ICD-10-CM | POA: Diagnosis not present

## 2023-09-08 NOTE — Patient Instructions (Signed)
Will increase botox dose from 100U to 200U
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
## 2023-10-20 ENCOUNTER — Encounter
Payer: BC Managed Care – PPO | Attending: Physical Medicine & Rehabilitation | Admitting: Physical Medicine & Rehabilitation

## 2023-10-20 ENCOUNTER — Encounter: Payer: Self-pay | Admitting: Physical Medicine & Rehabilitation

## 2023-10-20 VITALS — BP 114/83 | HR 68 | Ht 65.0 in | Wt 200.0 lb

## 2023-10-20 DIAGNOSIS — G8111 Spastic hemiplegia affecting right dominant side: Secondary | ICD-10-CM | POA: Diagnosis not present

## 2023-10-20 MED ORDER — INCOBOTULINUMTOXINA 100 UNITS IM SOLR
200.0000 [IU] | Freq: Once | INTRAMUSCULAR | Status: AC
  Administered 2023-10-20: 200 [IU] via INTRAMUSCULAR

## 2023-10-20 NOTE — Patient Instructions (Signed)
You received a Xeomin injection today. You may experience soreness at the needle injection sites. Please call us if any of the injection sites turns red after a couple days or if there is any drainage. You may experience muscle weakness as a result of Xeomin This would improve with time but can take several weeks to improve. The Xeomin should start working in about one week. The Xeomin usually last 3 months. The injection can be repeated every 3 months as needed.  

## 2023-10-20 NOTE — Progress Notes (Signed)
Xeomin injection for spasticity using needle EMG guidance  Dilution: 50 Units/ml Indication: Severe spasticity which interferes with ADL,mobility and/or  hygiene and is unresponsive to medication management and other conservative care Informed consent was obtained after describing risks and benefits of the procedure with the patient. This includes bleeding, bruising, infection, excessive weakness, or medication side effects. A REMS form is on file and signed. Needle: 27g 1" for upper ext and 25g 2" for lower ext  needle electrode Number of units per muscle Right  FPL 50U Biceps 25 Brachialis 25 Trap 25 Tib post 75 All injections were done after obtaining appropriate EMG activity and after negative drawback for blood. The patient tolerated the procedure well. Post procedure instructions were given. A followup appointment was made.

## 2023-11-30 ENCOUNTER — Encounter
Payer: BC Managed Care – PPO | Attending: Physical Medicine & Rehabilitation | Admitting: Physical Medicine & Rehabilitation

## 2023-11-30 ENCOUNTER — Encounter: Payer: Self-pay | Admitting: Physical Medicine & Rehabilitation

## 2023-11-30 VITALS — BP 107/73 | HR 70 | Ht 65.0 in | Wt 200.2 lb

## 2023-11-30 DIAGNOSIS — G8111 Spastic hemiplegia affecting right dominant side: Secondary | ICD-10-CM | POA: Insufficient documentation

## 2023-11-30 NOTE — Progress Notes (Signed)
Subjective:    Patient ID: [name], female    DOB: Jul 16, 1976, 48 y.o.   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  She underwent botulinum toxin injection to assist with right upper limb and right lower limb spasticity on 10/20/2023.  She is very satisfied with the result for both the upper limb and lower limb.  She has had some right thumb weakness when trying to open up medicine bottles but states that she can easily do this with her left side 10/20/23 Xeomin injection Right  FPL 50U Biceps 25 Brachialis 25 Trap 25 Tib post 75   07/28/2023 FPL 25U Post tib 75U  Some mild thumb weakness noted but overall better with elbow flexor spasticity and trapezius   Overall prefers effects from the December 20 24 injection over the September 20 24 injection Pain Inventory Average Pain 0 Pain Right Now 0 My pain is  no pain  In the last 24 hours, has pain interfered with the following? General activity 0 Relation with others 0 Enjoyment of life 0 What TIME of day is your pain at its worst? No pain Sleep (in general) Fair  Pain is worse with:  no pain Pain improves with:  no pain Relief from Meds:  no pain  Family History  Problem Relation Age of Onset   Breast cancer Mother    Emphysema Mother    Osteoporosis Mother    Diabetes Father        infectious pancreas   Lung cancer Father    Heart attack Father    Polycystic ovary syndrome Sister    Breast cancer Maternal Grandmother    Stroke Maternal Grandfather    Pancreatic cancer Paternal Grandmother    Colon cancer Neg Hx    Stomach cancer Neg Hx    Social History   Socioeconomic History   Marital status: Married    Spouse name: Not on file   Number of children: 3   Years of education: Not on file   Highest education level: Not on file  Occupational History   Occupation: Stay at home mom  Tobacco Use    Smoking status: Former    Current packs/day: 0.00    Types: Cigarettes    Quit date: 1999    Years since quitting: 26.0   Smokeless tobacco: Never   Tobacco comments:    Some college years  Vaping Use   Vaping status: Never Used  Substance and Sexual Activity   Alcohol use: Yes    Comment: social, 1 per week   Drug use: No   Sexual activity: Yes    Birth control/protection: Surgical  Other Topics Concern   Not on file  Social History Narrative   Not on file   Social Drivers of Health   Financial Resource Strain: Not on file  Food Insecurity: Not on file  Transportation Needs: Not on file  Physical Activity: Not on file  Stress: Not on file  Social Connections: Unknown (03/18/2022)   Received from [employer], [facility]   Social Network    Social Network: Not on file   Past Surgical History:  Procedure Laterality Date   ACHILLES TENDON SURGERY Bilateral    BRAIN SURGERY     Stent placed and removed   CESAREAN SECTION     x3   ENDOMETRIAL ABLATION W/ NOVASURE  11/08/1999   OPEN REDUCTION INTERNAL  FIXATION (ORIF) METACARPAL Right 03/14/2014   Procedure: OPEN REDUCTION INTERNAL FIXATION (ORIF) RIGHT FOURTH AND FIFTH METACARPAL;  Surgeon: Dominica Severin, MD;  Location: [facility];  Service: Orthopedics;  Laterality: Right;   STRABISMUS SURGERY     TOE SURGERY Right    TRACHEOSTOMY  11/07/1997   UPPER GASTROINTESTINAL ENDOSCOPY     Past Surgical History:  Procedure Laterality Date   ACHILLES TENDON SURGERY Bilateral    BRAIN SURGERY     Stent placed and removed   CESAREAN SECTION     x3   ENDOMETRIAL ABLATION W/ NOVASURE  11/08/1999   OPEN REDUCTION INTERNAL FIXATION (ORIF) METACARPAL Right 03/14/2014   Procedure: OPEN REDUCTION INTERNAL FIXATION (ORIF) RIGHT FOURTH AND FIFTH METACARPAL;  Surgeon: Dominica Severin, MD;  Location: [facility];  Service: Orthopedics;  Laterality: Right;   STRABISMUS SURGERY     TOE SURGERY Right     TRACHEOSTOMY  11/07/1997   UPPER GASTROINTESTINAL ENDOSCOPY     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   has slight rt sided weakness   BP 107/73   Pulse 70   Ht 5\' 5"  (1.651 m)   Wt 200 lb 3.2 oz (90.8 kg)   SpO2 97%   BMI 33.32 kg/m   Opioid Risk Score:   Fall Risk Score:  `1  Depression screen [facility] 2/9     11/30/2023   11:29 AM  Depression screen PHQ 2/9  Decreased Interest 0  Down, Depressed, Hopeless 0  PHQ - 2 Score 0     Review of Systems  Musculoskeletal:  Positive for gait problem.  All other systems reviewed and are negative.     Objective:   Physical Exam  Motor strength is 3 - right deltoid 4 - bicep tricep 4 - finger flexors and extensors thumb flexor is 3 - Right lower limb no evidence of clonus at the ankle she does not have any foot inversion during ambulation. Right upper extremity tone MAS 2 at the elbow flexors MAS 0 at the thumb flexor No tenderness to palpation over the right upper trap and there is no abnormal contraction of the trapezius during shoulder elevation.     Assessment & Plan:   1.  Right spastic hemiplegia secondary to remote left TBI.  Improvements with spasticity following botulinum toxin injection performed approxi-6 weeks ago.  Would continue with the current dose and muscle groups selection and repeat in 6 weeks. Xeomin 200 units, G80 1.11 [id number], [phone]

## 2023-12-28 DIAGNOSIS — S93602A Unspecified sprain of left foot, initial encounter: Secondary | ICD-10-CM | POA: Diagnosis not present

## 2024-01-12 DIAGNOSIS — R7301 Impaired fasting glucose: Secondary | ICD-10-CM | POA: Diagnosis not present

## 2024-01-12 DIAGNOSIS — R82998 Other abnormal findings in urine: Secondary | ICD-10-CM | POA: Diagnosis not present

## 2024-01-12 DIAGNOSIS — E782 Mixed hyperlipidemia: Secondary | ICD-10-CM | POA: Diagnosis not present

## 2024-01-19 ENCOUNTER — Encounter: Payer: Self-pay | Admitting: Physical Medicine & Rehabilitation

## 2024-01-19 ENCOUNTER — Telehealth: Payer: Self-pay

## 2024-01-19 ENCOUNTER — Encounter
Payer: BC Managed Care – PPO | Attending: Physical Medicine & Rehabilitation | Admitting: Physical Medicine & Rehabilitation

## 2024-01-19 VITALS — BP 117/83 | HR 69 | Ht 65.0 in | Wt 197.0 lb

## 2024-01-19 DIAGNOSIS — G8111 Spastic hemiplegia affecting right dominant side: Secondary | ICD-10-CM | POA: Diagnosis not present

## 2024-01-19 DIAGNOSIS — Z1331 Encounter for screening for depression: Secondary | ICD-10-CM | POA: Diagnosis not present

## 2024-01-19 DIAGNOSIS — Z0000 Encounter for general adult medical examination without abnormal findings: Secondary | ICD-10-CM | POA: Diagnosis not present

## 2024-01-19 DIAGNOSIS — K200 Eosinophilic esophagitis: Secondary | ICD-10-CM | POA: Diagnosis not present

## 2024-01-19 DIAGNOSIS — Z1339 Encounter for screening examination for other mental health and behavioral disorders: Secondary | ICD-10-CM | POA: Diagnosis not present

## 2024-01-19 DIAGNOSIS — Z23 Encounter for immunization: Secondary | ICD-10-CM | POA: Diagnosis not present

## 2024-01-19 MED ORDER — INCOBOTULINUMTOXINA 100 UNITS IM SOLR
100.0000 [IU] | Freq: Once | INTRAMUSCULAR | Status: AC
  Administered 2024-01-19: 100 [IU] via INTRAMUSCULAR

## 2024-01-19 MED ORDER — SODIUM CHLORIDE (PF) 0.9 % IJ SOLN
2.0000 mL | Freq: Once | INTRAMUSCULAR | Status: AC
  Administered 2024-01-19: 2 mL via INTRAVENOUS

## 2024-01-19 NOTE — Telephone Encounter (Signed)
 Pharmacy Patient Advocate Encounter   Received notification from CoverMyMeds that prior authorization for Dexlansoprazole 60MG  dr capsules is required/requested.   Insurance verification completed.   The patient is insured through [facility] .   Per test claim: PA required; PA submitted to above mentioned insurance via CoverMyMeds Key/confirmation #/EOC [id number] Status is pending

## 2024-01-19 NOTE — Progress Notes (Signed)
 Xeomin injection for spasticity using needle EMG guidance  Dilution: 50 Units/ml Indication: Severe spasticity which interferes with ADL,mobility and/or  hygiene and is unresponsive to medication management and other conservative care Informed consent was obtained after describing risks and benefits of the procedure with the patient. This includes bleeding, bruising, infection, excessive weakness, or medication side effects. A REMS form is on file and signed. Needle: 27g 1" for upper ext and 25g 2" for lower ext  needle electrode Number of units per muscle Right  FPL 50U Biceps 25 Brachialis 25 Trap 25 Tib post 75 All injections were done after obtaining appropriate EMG activity and after negative drawback for blood. The patient tolerated the procedure well. Post procedure instructions were given. A followup appointment was made.

## 2024-01-24 NOTE — Telephone Encounter (Signed)
 Pharmacy Patient Advocate Encounter  Received notification from [facility] that Prior Authorization for DEXLANSOPRAZOLE 60MG  has been DENIED.  Full denial letter will be uploaded to the media tab. See denial reason below.    PA #/Case ID/Reference #:  [id number]

## 2024-01-25 ENCOUNTER — Other Ambulatory Visit (HOSPITAL_COMMUNITY): Payer: Self-pay

## 2024-01-25 ENCOUNTER — Telehealth: Payer: Self-pay | Admitting: Pharmacist

## 2024-01-25 NOTE — Telephone Encounter (Signed)
 Dr Leonides Schanz, please review and let your nurse know how you would like to proceed. Thanks

## 2024-01-25 NOTE — Telephone Encounter (Signed)
 E-Appeal has been submitted for dexlansoprazole. Will advise when response is received, please be advised that most companies may take 30 days to make a decision. Appeal letter and supporting documentation have been uploaded and submitted via [employer]MM website.    Thank you, Dellie Burns, PharmD Clinical Pharmacist  [facility]  Direct Dial: [phone]

## 2024-01-25 NOTE — Telephone Encounter (Signed)
 Just to be sure, I did run a test claim for pantoprazole. A 30 per 30 would only be $10.08 for patient. Rabeprazole, which was on denial from last time, was also tested and gave a 30 per 30 co-pay of $15.04.   Just want to make sure you would still like to go forward with the appeal. Please advise.

## 2024-01-25 NOTE — Telephone Encounter (Signed)
 Information has been sent to clinical pharmacist for appeals review. It may take 5-7 days to prepare the necessary documentation to request the appeal from the insurance.

## 2024-01-30 ENCOUNTER — Other Ambulatory Visit: Payer: Self-pay

## 2024-01-30 DIAGNOSIS — K219 Gastro-esophageal reflux disease without esophagitis: Secondary | ICD-10-CM

## 2024-01-30 DIAGNOSIS — K200 Eosinophilic esophagitis: Secondary | ICD-10-CM

## 2024-01-30 MED ORDER — PANTOPRAZOLE SODIUM 40 MG PO TBEC: 40.0000 mg | DELAYED_RELEASE_TABLET | Freq: Two times a day (BID) | ORAL | 2 refills | Status: AC

## 2024-01-30 NOTE — Telephone Encounter (Signed)
 Pt made aware of recent denial and recommendations from Dr. Leonides Schanz. Prescription was sent to pharmacy: Pt made aware. Pt was scheduled to see Dr. Leonides Schanz on 05/07/2024 at 8:30 AM. Pt made aware. Pt verbalized understanding with all questions answered.

## 2024-01-30 NOTE — Telephone Encounter (Signed)
 Please see note below and advise

## 2024-01-30 NOTE — Telephone Encounter (Signed)
 Per [employer], the appeal for dexlansoprazole was denied with the following reason:    Below is the appeal letter that was sent:

## 2024-04-23 ENCOUNTER — Encounter: Admitting: Physical Medicine & Rehabilitation

## 2024-04-24 DIAGNOSIS — M9903 Segmental and somatic dysfunction of lumbar region: Secondary | ICD-10-CM | POA: Diagnosis not present

## 2024-04-24 DIAGNOSIS — M461 Sacroiliitis, not elsewhere classified: Secondary | ICD-10-CM | POA: Diagnosis not present

## 2024-04-24 DIAGNOSIS — M5451 Vertebrogenic low back pain: Secondary | ICD-10-CM | POA: Diagnosis not present

## 2024-04-24 DIAGNOSIS — M9905 Segmental and somatic dysfunction of pelvic region: Secondary | ICD-10-CM | POA: Diagnosis not present

## 2024-04-24 DIAGNOSIS — M9902 Segmental and somatic dysfunction of thoracic region: Secondary | ICD-10-CM | POA: Diagnosis not present

## 2024-04-26 ENCOUNTER — Encounter: Payer: Self-pay | Admitting: Physical Medicine & Rehabilitation

## 2024-04-26 ENCOUNTER — Encounter: Attending: Physical Medicine & Rehabilitation | Admitting: Physical Medicine & Rehabilitation

## 2024-04-26 VITALS — BP 119/80 | HR 65 | Ht 65.0 in | Wt 177.0 lb

## 2024-04-26 DIAGNOSIS — G8111 Spastic hemiplegia affecting right dominant side: Secondary | ICD-10-CM | POA: Insufficient documentation

## 2024-04-26 MED ORDER — INCOBOTULINUMTOXINA 100 UNITS IM SOLR
200.0000 [IU] | Freq: Once | INTRAMUSCULAR | Status: AC
  Administered 2024-04-26: 200 [IU] via INTRAMUSCULAR

## 2024-04-26 MED ORDER — SODIUM CHLORIDE (PF) 0.9 % IJ SOLN
4.0000 mL | Freq: Once | INTRAMUSCULAR | Status: AC
  Administered 2024-04-26: 4 mL

## 2024-04-26 NOTE — Progress Notes (Signed)
 Xeomin injection for spasticity using needle EMG guidance  Dilution: 50 Units/ml Indication: Severe spasticity which interferes with ADL,mobility and/or  hygiene and is unresponsive to medication management and other conservative care Informed consent was obtained after describing risks and benefits of the procedure with the patient. This includes bleeding, bruising, infection, excessive weakness, or medication side effects. A REMS form is on file and signed. Needle: 27g 1" for upper ext and 25g 2" for lower ext  needle electrode Number of units per muscle Right  FPL 50U Biceps 25 Brachialis 25 Trap 25 Tib post 75 All injections were done after obtaining appropriate EMG activity and after negative drawback for blood. The patient tolerated the procedure well. Post procedure instructions were given. A followup appointment was made.

## 2024-05-07 ENCOUNTER — Ambulatory Visit: Admitting: Internal Medicine

## 2024-07-30 ENCOUNTER — Encounter: Payer: Self-pay | Admitting: Physical Medicine & Rehabilitation

## 2024-07-30 ENCOUNTER — Encounter: Attending: Physical Medicine & Rehabilitation | Admitting: Physical Medicine & Rehabilitation

## 2024-07-30 VITALS — BP 116/75 | HR 63 | Ht 65.0 in | Wt 184.0 lb

## 2024-07-30 DIAGNOSIS — G8111 Spastic hemiplegia affecting right dominant side: Secondary | ICD-10-CM | POA: Insufficient documentation

## 2024-07-30 MED ORDER — SODIUM CHLORIDE (PF) 0.9 % IJ SOLN
4.0000 mL | Freq: Once | INTRAMUSCULAR | Status: AC
  Administered 2024-07-30: 4 mL

## 2024-07-30 MED ORDER — INCOBOTULINUMTOXINA 100 UNITS IM SOLR
200.0000 [IU] | Freq: Once | INTRAMUSCULAR | Status: AC
  Administered 2024-07-30: 200 [IU] via INTRAMUSCULAR

## 2024-07-30 NOTE — Progress Notes (Signed)
 Xeomin  injection for spasticity using needle EMG guidance  Dilution: 50 Units/ml Indication: Severe spasticity which interferes with ADL,mobility and/or  hygiene and is unresponsive to medication management and other conservative care Informed consent was obtained after describing risks and benefits of the procedure with the patient. This includes bleeding, bruising, infection, excessive weakness, or medication side effects. A REMS form is on file and signed. Needle: 27g 1 for upper ext and 25g 2 for lower ext  needle electrode Number of units per muscle Right  FPL 25U Biceps 25 Brachialis 50 Trap 25 Tib post 50 Soleus 25 All injections were done after obtaining appropriate EMG activity and after negative drawback for blood. The patient tolerated the procedure well. Post procedure instructions were given. A followup appointment was made.

## 2024-07-30 NOTE — Patient Instructions (Signed)
 Since we modified the treatment , will need to see you in 6wks to check effect

## 2024-09-10 ENCOUNTER — Encounter: Payer: Self-pay | Admitting: Physical Medicine & Rehabilitation

## 2024-09-10 ENCOUNTER — Encounter: Attending: Physical Medicine & Rehabilitation | Admitting: Physical Medicine & Rehabilitation

## 2024-09-10 VITALS — BP 118/76 | HR 71 | Ht 65.0 in | Wt 184.0 lb

## 2024-09-10 DIAGNOSIS — G8111 Spastic hemiplegia affecting right dominant side: Secondary | ICD-10-CM | POA: Insufficient documentation

## 2024-09-10 NOTE — Progress Notes (Signed)
 Subjective:    Patient ID: [name], female    DOB: [birth year], 48 y.o.   MRN: [id number]  HPI Discussed the use of AI scribe software for clinical note transcription with the patient, who gave verbal consent to proceed.  History of Present Illness [name] is a 48 year old female who presents for follow-up after botulinum toxin injections for muscle spasticity.  She reports improvement in her gait following the recent botulinum toxin injections, and her son has noticed her gait is better during walks.  Her thumb received a reduced dose of injection this time, and she does not feel it is too tight. The elbow flexor feels manageable, although she experiences a 'popping' sensation with movement, possibly due to a tendon slipping.  The right trapezius muscle feels good. She received a total dose of 200 units, distributed among the biceps, brachialis, trapezius, tibialis posterior, and soleus muscles.  She discusses her sleep habits, mentioning that she tends to 'crunch' her hand while sleeping.   Pain Inventory Average Pain 5 Pain Right Now 5 My pain is dull and tingling  In the last 24 hours, has pain interfered with the following? General activity 3 Relation with others 3 Enjoyment of life 3 What TIME of day is your pain at its worst? morning  and night Sleep (in general) Fair  Pain is worse with: some activites Pain improves with: medication Relief from Meds: 3  Family History  Problem Relation Age of Onset   Breast cancer Mother    Emphysema Mother    Osteoporosis Mother    Diabetes Father        infectious pancreas   Lung cancer Father    Heart attack Father    Polycystic ovary syndrome Sister    Breast cancer Maternal Grandmother    Stroke Maternal Grandfather    Pancreatic cancer Paternal Grandmother    Colon cancer Neg Hx    Stomach cancer Neg Hx    Social History   Socioeconomic History   Marital status: Married    Spouse name: Not on file    Number of children: 3   Years of education: Not on file   Highest education level: Not on file  Occupational History   Occupation: Stay at home mom  Tobacco Use   Smoking status: Former    Current packs/day: 0.00    Types: Cigarettes    Quit date: 1999    Years since quitting: 26.8   Smokeless tobacco: Never   Tobacco comments:    Some college years  Vaping Use   Vaping status: Never Used  Substance and Sexual Activity   Alcohol  use: Yes    Comment: social, 1 per week   Drug use: No   Sexual activity: Yes    Birth control/protection: Surgical  Other Topics Concern   Not on file  Social History Narrative   Not on file   Social Drivers of Health   Financial Resource Strain: Not on file  Food Insecurity: Not on file  Transportation Needs: Not on file  Physical Activity: Not on file  Stress: Not on file  Social Connections: Unknown (03/18/2022)   Received from [employer]   Social Network    Social Network: Not on file   Past Surgical History:  Procedure Laterality Date   ACHILLES TENDON SURGERY Bilateral    BRAIN SURGERY     Stent placed and removed   CESAREAN SECTION     x3   ENDOMETRIAL  ABLATION W/ NOVASURE  11/08/1999   OPEN REDUCTION INTERNAL FIXATION (ORIF) METACARPAL Right 03/14/2014   Procedure: OPEN REDUCTION INTERNAL FIXATION (ORIF) RIGHT FOURTH AND FIFTH METACARPAL;  Surgeon: Elsie Mussel, MD;  Location: [facility];  Service: Orthopedics;  Laterality: Right;   STRABISMUS SURGERY     TOE SURGERY Right    TRACHEOSTOMY  11/07/1997   UPPER GASTROINTESTINAL ENDOSCOPY     Past Surgical History:  Procedure Laterality Date   ACHILLES TENDON SURGERY Bilateral    BRAIN SURGERY     Stent placed and removed   CESAREAN SECTION     x3   ENDOMETRIAL ABLATION W/ NOVASURE  11/08/1999   OPEN REDUCTION INTERNAL FIXATION (ORIF) METACARPAL Right 03/14/2014   Procedure: OPEN REDUCTION INTERNAL FIXATION (ORIF) RIGHT FOURTH AND FIFTH METACARPAL;   Surgeon: Elsie Mussel, MD;  Location: [facility];  Service: Orthopedics;  Laterality: Right;   STRABISMUS SURGERY     TOE SURGERY Right    TRACHEOSTOMY  11/07/1997   UPPER GASTROINTESTINAL ENDOSCOPY     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   has slight rt sided weakness   BP 118/76   Pulse 71   Ht 5' 5 (1.651 m)   Wt 184 lb (83.5 kg)   SpO2 94%   BMI 30.62 kg/m   Opioid Risk Score:   Fall Risk Score:  `1  Depression screen [facility] 2/9     07/30/2024   11:38 AM 11/30/2023   11:29 AM  Depression screen PHQ 2/9  Decreased Interest 0 0  Down, Depressed, Hopeless 0 0  PHQ - 2 Score 0 0      Review of Systems  Musculoskeletal:        Right shoulder pain Right hand pain  All other systems reviewed and are negative.      Objective:   Physical Exam Vitals and nursing note reviewed.  Constitutional:      Appearance: She is obese.  HENT:     Head: Normocephalic and atraumatic.  Neurological:     Mental Status: She is alert and oriented to person, place, and time.  Psychiatric:        Mood and Affect: Mood normal.        Behavior: Behavior normal.   Right upper extremity movement is 4 - at the deltoid bicep tricep finger flexors and extensors with increased tone in the flexor muscle groups Right upper extremity tone MAS 2 at the elbow flexors MAS 1 at the thumb flexor  Right lower extremity no evidence of clonus at the ankle or at the foot inverters. With ambulation there is no evidence of foot inversion. Ambulates without assistive device no evidence of toe drag or knee instability she does have a stifflegged gait with decreased right knee flexion     Assessment & Plan:  Assessment and Plan Assessment & Plan Severe  spasticity Adjusted dosing in FPL, biceps, brachialis, trapezius, tibialis posterior, and soleus. Total dose 200 units. Improvement in gait and satisfaction with dosing. Right lower extremity, especially tibialis posterior, improved. Thumb and elbow flexor well-managed. Dosing balances spasticity control and muscle strength. - Continue current regimen. Repeat injections around Christmas, schedule permitting. - Prescribe resting hand splint for night use. -  Provide paper prescription for brace. - Instruct her to visit [facility] for splint fitting. - Schedule follow-up in six weeks.   Xeomin  Right  FPL 25U Biceps 25 Brachialis 50 Trap 25 Tib post 50 Soleus 25

## 2024-09-10 NOTE — Patient Instructions (Signed)
  VISIT SUMMARY: Today, we reviewed your progress after your recent botulinum toxin injections for muscle spasticity. You reported improvements in your gait and overall satisfaction with the treatment.  YOUR PLAN: SEVERE SPASTICITY: You have severe muscle spasticity, and we adjusted the dosing of your botulinum toxin injections to help manage this. -Continue with the current regimen of botulinum toxin injections. We will plan to repeat the injections around Christmas, depending on the schedule. -I am prescribing a resting hand splint for you to use at night to help with your hand position while you sleep. -I will provide you with a paper prescription for the brace. Please visit [facility] to get fitted for the splint. -Schedule a follow-up appointment in six weeks to monitor your progress.                      Contains text generated by Abridge.                                 Contains text generated by Abridge.

## 2024-10-22 ENCOUNTER — Encounter: Payer: Self-pay | Admitting: Physical Medicine & Rehabilitation

## 2024-10-22 ENCOUNTER — Encounter: Admitting: Physical Medicine & Rehabilitation

## 2024-10-22 VITALS — BP 142/84 | HR 66 | Ht 65.0 in | Wt 184.0 lb

## 2024-10-22 NOTE — Progress Notes (Unsigned)
 Xeomin  injection did not receive prior auth, website is down will reschedule pt for Xeomin  200U , [id number],35356 G 81.11

## 2024-12-03 ENCOUNTER — Encounter: Admitting: Physical Medicine & Rehabilitation

## 2025-01-03 ENCOUNTER — Encounter: Admitting: Physical Medicine & Rehabilitation
